# Patient Record
Sex: Female | Born: 1960 | Race: White | Hispanic: No | Marital: Married | Smoking: Former smoker
Health system: Southern US, Community
[De-identification: ages and names within clinical notes are randomized; demographics above are authoritative.]

## PROBLEM LIST (undated history)

## (undated) DIAGNOSIS — F32A Depression, unspecified: Secondary | ICD-10-CM

## (undated) DIAGNOSIS — E785 Hyperlipidemia, unspecified: Secondary | ICD-10-CM

## (undated) DIAGNOSIS — K219 Gastro-esophageal reflux disease without esophagitis: Secondary | ICD-10-CM

## (undated) DIAGNOSIS — J449 Chronic obstructive pulmonary disease, unspecified: Secondary | ICD-10-CM

## (undated) DIAGNOSIS — N289 Disorder of kidney and ureter, unspecified: Secondary | ICD-10-CM

## (undated) DIAGNOSIS — Z9989 Dependence on other enabling machines and devices: Secondary | ICD-10-CM

## (undated) DIAGNOSIS — L02215 Cutaneous abscess of perineum: Secondary | ICD-10-CM

## (undated) DIAGNOSIS — D649 Anemia, unspecified: Secondary | ICD-10-CM

## (undated) DIAGNOSIS — K5792 Diverticulitis of intestine, part unspecified, without perforation or abscess without bleeding: Secondary | ICD-10-CM

## (undated) DIAGNOSIS — R197 Diarrhea, unspecified: Secondary | ICD-10-CM

## (undated) DIAGNOSIS — G2581 Restless legs syndrome: Secondary | ICD-10-CM

## (undated) DIAGNOSIS — R011 Cardiac murmur, unspecified: Secondary | ICD-10-CM

## (undated) DIAGNOSIS — F329 Major depressive disorder, single episode, unspecified: Secondary | ICD-10-CM

## (undated) DIAGNOSIS — I10 Essential (primary) hypertension: Secondary | ICD-10-CM

## (undated) DIAGNOSIS — M1990 Unspecified osteoarthritis, unspecified site: Secondary | ICD-10-CM

## (undated) DIAGNOSIS — Z8719 Personal history of other diseases of the digestive system: Secondary | ICD-10-CM

## (undated) DIAGNOSIS — J45909 Unspecified asthma, uncomplicated: Secondary | ICD-10-CM

## (undated) DIAGNOSIS — G4733 Obstructive sleep apnea (adult) (pediatric): Secondary | ICD-10-CM

## (undated) HISTORY — PX: TUBAL LIGATION: SHX77

## (undated) HISTORY — DX: Essential (primary) hypertension: I10

## (undated) HISTORY — DX: Diarrhea, unspecified: R19.7

## (undated) HISTORY — PX: ANTERIOR AND POSTERIOR REPAIR: SHX1172

## (undated) HISTORY — PX: ABSCESS DRAINAGE: SHX1119

## (undated) HISTORY — PX: INCONTINENCE SURGERY: SHX676

## (undated) HISTORY — DX: Diverticulitis of intestine, part unspecified, without perforation or abscess without bleeding: K57.92

## (undated) HISTORY — DX: Unspecified asthma, uncomplicated: J45.909

## (undated) HISTORY — PX: CERVICAL CONE BIOPSY: SUR198

---

## 2013-02-04 DIAGNOSIS — G4733 Obstructive sleep apnea (adult) (pediatric): Secondary | ICD-10-CM

## 2013-02-04 HISTORY — DX: Obstructive sleep apnea (adult) (pediatric): G47.33

## 2013-10-05 DIAGNOSIS — K5792 Diverticulitis of intestine, part unspecified, without perforation or abscess without bleeding: Secondary | ICD-10-CM

## 2013-10-05 HISTORY — DX: Diverticulitis of intestine, part unspecified, without perforation or abscess without bleeding: K57.92

## 2014-03-01 ENCOUNTER — Encounter: Payer: Self-pay | Admitting: Gastroenterology

## 2014-03-23 ENCOUNTER — Ambulatory Visit (INDEPENDENT_AMBULATORY_CARE_PROVIDER_SITE_OTHER): Payer: BLUE CROSS/BLUE SHIELD | Admitting: Gastroenterology

## 2014-03-23 ENCOUNTER — Other Ambulatory Visit: Payer: Self-pay

## 2014-03-23 ENCOUNTER — Encounter (INDEPENDENT_AMBULATORY_CARE_PROVIDER_SITE_OTHER): Payer: Self-pay

## 2014-03-23 ENCOUNTER — Encounter: Payer: Self-pay | Admitting: Gastroenterology

## 2014-03-23 VITALS — BP 123/77 | HR 76 | Temp 97.4°F | Ht 61.0 in | Wt 277.0 lb

## 2014-03-23 DIAGNOSIS — R1013 Epigastric pain: Secondary | ICD-10-CM

## 2014-03-23 DIAGNOSIS — K5700 Diverticulitis of small intestine with perforation and abscess without bleeding: Secondary | ICD-10-CM

## 2014-03-23 DIAGNOSIS — R197 Diarrhea, unspecified: Secondary | ICD-10-CM

## 2014-03-23 MED ORDER — PEG-KCL-NACL-NASULF-NA ASC-C 100 G PO SOLR: 1.0000 | ORAL | Status: DC

## 2014-03-23 NOTE — Assessment & Plan Note (Addendum)
SX CHRONIC. MOST LIKELY DUE TO IBS BUT CELIAC SPRUE IN DIFFERENTIAL DIAGNOSIS, LESS LIKELY LACTOSE INTOLERANCE OR SIBO..  EGD TO EVALUATE FOR CELIAC SPRUE

## 2014-03-23 NOTE — Patient Instructions (Signed)
COMPLETE ENDOSCOPY WITHIN THE NEXT 3-4 WEEKS.  FULL LIQUID BREAKFAST ON MORNING OF EXAM. CLEAR LIQUIDS AFTER 9 AM. SEE INFO BELOW.  FOLLOW UP IN 4 MOS.   Full Liquid Diet A high-calorie, high-protein supplement should be used to meet your nutritional requirements when the full liquid diet is continued for more than 2 or 3 days. If this diet is to be used for an extended period of time (more than 7 days), a multivitamin should be considered.  Breads and Starches  Allowed: None are allowed except crackers WHOLE OR pureed (made into a thick, smooth soup) in soup. Cooked, refined corn, oat, rice, rye, and wheat cereals are also allowed.   Avoid: Any others.    Potatoes/Pasta/Rice  Allowed: ANY ITEM AS A SOUP OR SMALL PLATE OF MASHED POTATOES OR RICE.       Vegetables  Allowed: Strained tomato or vegetable juice. Vegetables pureed in soup.   Avoid: Any others.    Fruit  Allowed: Any strained fruit juices and fruit drinks. Include 1 serving of citrus or vitamin C-enriched fruit juice daily.   Avoid: Any others.  Meat and Meat Substitutes  Allowed: Egg  Avoid: Any meat, fish, or fowl. All cheese.  Milk  Allowed: Milk beverages, including milk shakes and instant breakfast mixes. Smooth yogurt.   Avoid: Any others. Avoid dairy products if not tolerated.    Soups and Combination Foods  Allowed: Broth, strained cream soups. Strained, broth-based soups.   Avoid: Any others.    Desserts and Sweets  Allowed: flavored gelatin, tapioca, plain ice cream, sherbet, smooth pudding, junket, fruit ices, frozen ice pops, pudding pops,, frozen fudge pops, chocolate syrup. Sugar, honey, jelly, syrup.   Avoid: Any others.  Fats and Oils  Allowed: Margarine, butter, cream, sour cream, oils.   Avoid: Any others.  Beverages  Allowed: All.   Avoid: None.  Condiments  Allowed: Iodized salt, pepper, spices, flavorings. Cocoa powder.   Avoid: Any others.    SAMPLE MEAL  PLAN Breakfast   cup orange juice.   1 cup cooked wheat cereal.   1 cup  milk.   1 cup beverage (coffee or tea).   Cream or sugar, if desired.    Midmorning Snack  2 SCRAMBLED OR HARD BOILED EGG   Lunch  1 cup cream soup.    cup fruit juice.   1 cup milk.    cup custard.   1 cup beverage (coffee or tea).   Cream or sugar, if desired.    Midafternoon Snack  1 cup milk shake.  Dinner  1 cup cream soup.    cup fruit juice.   1 cup milk.    cup pudding.   1 cup beverage (coffee or tea).   Cream or sugar, if desired.  Evening Snack  1 cup supplement.  To increase calories, add sugar, cream, butter, or margarine if possible. Nutritional supplements will also increase the total calories.     CLEAR LIQUID DIET  Nutrition Facts A clear liquid diet is not adequate in calories and nutrients. It should not be used for more than five days unless high-protein gelatin or other low-residue supplements are added. Special Considerations  1. Limitations The physician may limit certain liquids, depending on the patient's condition, or the surgery or test being performed. Therefore, individual instructions should be strictly followed. 2. What is a clear liquid? A good rule-of-thumb is anything you can see through. For example, apple juice is a clear liquid; milk is not.  If unsure, check with the physician or registered dietition. 3. After surgery and fasting Should persistent abdominal cramps or discomfort occur with a clear liquid diet, the patient should notify the physician, nurse, or dietitian at once. 4. Preparing for a medical test It is important that the clear liquid diet be followed exactly. Remember that the value of the examination will depend on getting a thoroughly clean digestive tract.   Food Groups  Group Recommend Avoid  Milk & milk products none all  Vegetables none all  Fruits fruit juices without pulp nectars; all fresh, canned, and  frozen fruits  Breads & grains none all  Meat or meat substitutes none all  Fats & oils none all  Sweets & desserts gelatin, fruit ice, popsicle without pulp, clear hard candy all others  Beverages coffee; tea; soft drinks; water; lactose-free, low residue supplements if approved by physiciancoffee; tea; soft drinks; water; lactose-free, low residue supplements if approved by physician all others  Soups bouillon, consomm fat free broth all others   Sample Menu  Breakfast Lunch Dinner  strained fruit juice 1 cup  gelatin 1 cup  hot tea with sugar & lemon consomm 3/4 cup  strained fruit juice 1 cup  fruit ice 1/2 cup  gelatin 1/2 cup  hot tea with sugar & lemon consomm 3/4 cup  strained fruit juice 1 cup  fruit ice 1/2 cup  gelatin 1/2 cup  hot tea with sugar & lemon   This Sample Diet Provides the Following  Calories 600 Fat virtually none  Protein 6 gm Sodium 1500 mg  Carbohydrates 209 gm Potassium 1440 mg

## 2014-03-23 NOTE — Progress Notes (Signed)
ON RECALL LIST  °

## 2014-03-23 NOTE — Progress Notes (Signed)
Subjective:    Patient ID: [name] [name], female    DOB: [birth year], 54 y.o.   MRN: [id number]  Moshe Cipro, MD  HPI Has pain in both sides of abdomen. BEFORE AGE 39 CONSTIPATION. AFTER AGE 39 HAS HAD DIARRHEA: NO TRIGGERS. USES IMODIUM TO CONTROL SYMPTOMS.  LOST DOWN TO 150 LBS. 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 WHEN SHE UPSET SHE EATS. HAD EGD 2000-DYSPEPSIA. MAY WAKE UP IN MIDDLE NIGHT WITH DIARRHEA: ONLY A COUPLE OF TIMES. BMs IF SHE EATS: AM WORSE(4/DAY NO BLOOD). MAY SEE BLOOD WHEN SHE WIPES.  NAUSEA: DAILY. HEARTBURN(BURNING IN CHEST) EVERY DAY: PROTONIX ONCE A DAY AND TUMS PRN. AT TIMES SHE CAN HAVE HARD STOOL(CONSTIPATION). MILK: NONE. ICE CREAM: COUPLE TIMES A MONTH. CHEESE: EATING SALADS AND NOW CHEESE DAILY. STOOLS NO WORSE. NO CYCLE SINCE JUL 2015. WAS HAVING HEAVY PERIODS.   PT DENIES FEVER, CHILLS,  HEMATEMESIS, vomiting, melena, SHORTNESS OF BREATH,  CHANGE IN BOWEL IN HABITS, constipation, problems swallowing, OR  problems with sedation.  Past Medical History  Diagnosis Date  . Diarrhea age 79  . Sleep apnea 2015  . HTN (hypertension)   . Asthma   . Diverticulitis SEP 2015    PERFORATED, SIGMOID COLON   Past Surgical History  Procedure Laterality Date  . Tubal ligation    . Cervical cone biopsy    . Bladder surgery    . Rectocele repair     Allergies  Allergen Reactions  . Penicillins Shortness Of Breath   Current Outpatient Prescriptions  Medication Sig Dispense Refill  . albuterol (PROVENTIL HFA;VENTOLIN HFA) 108 (90 BASE) MCG/ACT inhaler Inhale into the lungs every 6 (six) hours as needed for wheezing or shortness of breath.    Marland Kitchen atorvastatin (LIPITOR) 10 MG tablet Take 10 mg by mouth daily.    Marland Kitchen buPROPion (ZYBAN) 150 MG 12 hr tablet Take 150 mg by mouth 2 (two) times daily.    . ferrous fumarate-iron polysaccharide complex (TANDEM) 162-115.2 MG CAPS Take 1 capsule by mouth daily with breakfast. MOST DAYS   . Fluticasone-Salmeterol  (ADVAIR) 250-50 MCG/DOSE AEPB Inhale 1 puff into the lungs 2 (two) times daily.    Marland Kitchen losartan (COZAAR) 50 MG tablet Take 50 mg by mouth daily.    . nebivolol (BYSTOLIC) 5 MG tablet Take 5 mg by mouth daily.    . pantoprazole (PROTONIX) 40 MG tablet Take 40 mg by mouth daily.     . pramipexole (MIRAPEX) 0.5 MG tablet Take 0.5 mg by mouth 3 (three) times daily.    Marland Kitchen venlafaxine XR (EFFEXOR-XR) 150 MG 24 hr capsule Take 150 mg by mouth daily with breakfast.     Family History  Problem Relation Age of Onset  . Diverticulitis Mother   . Diverticulitis Maternal Aunt   . Diverticulitis Maternal Grandmother     History  Substance Use Topics  . Smoking status: Former Smoker    Quit date: 05/21/1997  . Smokeless tobacco: Not on file  . Alcohol Use: Not on file   Review of Systems PER HPI OTHERWISE ALL SYSTEMS ARE NEGATIVE.     Objective:   Physical Exam  Constitutional: She is oriented to person, place, and time. She appears well-developed and well-nourished. No distress.  HENT:  Head: Normocephalic and atraumatic.  Mouth/Throat: Oropharynx is clear and moist. No oropharyngeal exudate.  Eyes: Pupils are equal, round, and reactive to light. No scleral icterus.  Neck: Normal range of motion. Neck supple.  Cardiovascular: Normal rate,  regular rhythm and normal heart sounds.   Pulmonary/Chest: Effort normal and breath sounds normal. No respiratory distress.  Abdominal: Soft. Bowel sounds are normal. She exhibits no distension. There is no tenderness.  Musculoskeletal: She exhibits no edema.  Lymphadenopathy:    She has no cervical adenopathy.  Neurological: She is alert and oriented to person, place, and time.  NO FOCAL DEFICITS   Psychiatric: She has a normal mood and affect.  Vitals reviewed.         Assessment & Plan:

## 2014-03-23 NOTE — Assessment & Plan Note (Signed)
SX RESOLVED.  NEEDS TCS TO EVALUATE COLON. CONSIDER SIGMOID COLECTOMY. DISCUSSED PROCEDURE, BENEFITS, & RISKS: < 1% chance of medication reaction, bleeding, perforation, or rupture of spleen/liver. MOVIPREP-PHENRGANI N PREOP-CPAP FOLLOW UP IN 4 MOS.

## 2014-03-23 NOTE — Assessment & Plan Note (Signed)
SX MAY BE DUE TO IBS, LESS LIKELY CELIAC SPRUE OR H PYLORI GASTRITIS.  EGD W/I 3-4 WEEKS. DISCUSSED PROCEDURE, BENEFITS, & RISKS: < 1% chance of medication reaction, OR bleeding.

## 2014-03-29 NOTE — Progress Notes (Signed)
CC'ED TO PCP 

## 2014-04-05 ENCOUNTER — Encounter (HOSPITAL_COMMUNITY): Payer: Self-pay | Admitting: *Deleted

## 2014-04-05 ENCOUNTER — Ambulatory Visit (HOSPITAL_COMMUNITY)
Admission: RE | Admit: 2014-04-05 | Discharge: 2014-04-05 | Disposition: A | Discharge status: Home / Self Care | Payer: BLUE CROSS/BLUE SHIELD | Source: Ambulatory Visit | Attending: Gastroenterology | Admitting: Gastroenterology

## 2014-04-05 ENCOUNTER — Encounter (HOSPITAL_COMMUNITY)
Admission: RE | Disposition: A | Discharge status: Home / Self Care | Payer: Self-pay | Source: Ambulatory Visit | Attending: Gastroenterology

## 2014-04-05 DIAGNOSIS — K648 Other hemorrhoids: Secondary | ICD-10-CM | POA: Insufficient documentation

## 2014-04-05 DIAGNOSIS — J45909 Unspecified asthma, uncomplicated: Secondary | ICD-10-CM | POA: Diagnosis not present

## 2014-04-05 DIAGNOSIS — Z9851 Tubal ligation status: Secondary | ICD-10-CM | POA: Insufficient documentation

## 2014-04-05 DIAGNOSIS — K317 Polyp of stomach and duodenum: Secondary | ICD-10-CM | POA: Diagnosis not present

## 2014-04-05 DIAGNOSIS — Z880 Allergy status to penicillin: Secondary | ICD-10-CM | POA: Insufficient documentation

## 2014-04-05 DIAGNOSIS — K5730 Diverticulosis of large intestine without perforation or abscess without bleeding: Secondary | ICD-10-CM | POA: Diagnosis not present

## 2014-04-05 DIAGNOSIS — G4730 Sleep apnea, unspecified: Secondary | ICD-10-CM | POA: Insufficient documentation

## 2014-04-05 DIAGNOSIS — R1013 Epigastric pain: Secondary | ICD-10-CM | POA: Diagnosis present

## 2014-04-05 DIAGNOSIS — Z87891 Personal history of nicotine dependence: Secondary | ICD-10-CM | POA: Diagnosis not present

## 2014-04-05 DIAGNOSIS — R197 Diarrhea, unspecified: Secondary | ICD-10-CM | POA: Diagnosis present

## 2014-04-05 DIAGNOSIS — I10 Essential (primary) hypertension: Secondary | ICD-10-CM | POA: Insufficient documentation

## 2014-04-05 DIAGNOSIS — K2950 Unspecified chronic gastritis without bleeding: Secondary | ICD-10-CM | POA: Diagnosis not present

## 2014-04-05 HISTORY — PX: COLONOSCOPY: SHX5424

## 2014-04-05 HISTORY — PX: ESOPHAGOGASTRODUODENOSCOPY: SHX5428

## 2014-04-05 SURGERY — COLONOSCOPY
Anesthesia: Moderate Sedation

## 2014-04-05 MED ORDER — PROMETHAZINE HCL 25 MG/ML IJ SOLN
INTRAMUSCULAR | Status: DC | PRN
  Administered 2014-04-05: 12.5 mg via INTRAVENOUS

## 2014-04-05 MED ORDER — PROMETHAZINE HCL 25 MG/ML IJ SOLN
12.5000 mg | Freq: Once | INTRAMUSCULAR | Status: AC
  Administered 2014-04-05: 12.5 mg via INTRAVENOUS
  Filled 2014-04-05: qty 1

## 2014-04-05 MED ORDER — SODIUM CHLORIDE 0.9 % IJ SOLN
INTRAMUSCULAR | Status: AC
  Filled 2014-04-05: qty 3

## 2014-04-05 MED ORDER — MEPERIDINE HCL 100 MG/ML IJ SOLN
INTRAMUSCULAR | Status: AC
  Filled 2014-04-05: qty 2

## 2014-04-05 MED ORDER — LIDOCAINE VISCOUS 2 % MT SOLN
OROMUCOSAL | Status: AC
  Filled 2014-04-05: qty 15

## 2014-04-05 MED ORDER — SODIUM CHLORIDE 0.9 % IV SOLN
INTRAVENOUS | Status: DC
  Administered 2014-04-05: 11:00:00 via INTRAVENOUS

## 2014-04-05 MED ORDER — STERILE WATER FOR IRRIGATION IR SOLN
Status: DC | PRN
  Administered 2014-04-05: 11:00:00

## 2014-04-05 MED ORDER — MEPERIDINE HCL 100 MG/ML IJ SOLN
INTRAMUSCULAR | Status: DC | PRN
  Administered 2014-04-05: 50 mg via INTRAVENOUS
  Administered 2014-04-05 (×4): 25 mg via INTRAVENOUS

## 2014-04-05 MED ORDER — MIDAZOLAM HCL 5 MG/5ML IJ SOLN
INTRAMUSCULAR | Status: DC | PRN
  Administered 2014-04-05 (×4): 1 mg via INTRAVENOUS
  Administered 2014-04-05: 2 mg via INTRAVENOUS
  Administered 2014-04-05: 1 mg via INTRAVENOUS

## 2014-04-05 MED ORDER — MIDAZOLAM HCL 5 MG/5ML IJ SOLN
INTRAMUSCULAR | Status: AC
  Filled 2014-04-05: qty 10

## 2014-04-05 MED ORDER — PROMETHAZINE HCL 25 MG/ML IJ SOLN: 12.5000 mg | Freq: Once | INTRAMUSCULAR | Status: DC

## 2014-04-05 NOTE — Discharge Instructions (Signed)
NO OBVIOUS SOURCE FOR YOUR DIARRHEA WAS IDENTIFIED. You have internal hemorrhoids & diverticulosis IN YOUR SIGMOID COLON.  I TATTOOED THE BASE WHERE THE DIVERTICULA START. You have gastritis AND GASTRIC POLYPS. I biopsied your stomach, SMALL BOWEL, AND COLON.   FOLLOW A HIGH FIBER/LOW FAT DIET. AVOID ITEMS THAT CAUSE BLOATING. SEE INFO BELOW.  AVOID ITEMS THAT TRIGGER GASTRITIS. SEE INFO BELOW  CONTINUE YOUR WEIGHT LOSS EFFORTS. LOSE 10 LBS.  CONTINUE PROTONIX. TAKE 30 MINUTES PRIOR TO BREAKFAST.  YOUR BIOPSY RESULTS WILL BE AVAILABLE IN MY CHART AFTER MAR 3  OR MY OFFICE WILL CONTACT YOU IN 10-14 DAYS WITH YOUR RESULTS.   FOLLOW UP IN JUN 2016.  Next colonoscopy in 10 years.    ENDOSCOPY Care After Read the instructions outlined below and refer to this sheet in the next week. These discharge instructions provide you with general information on caring for yourself after you leave the hospital. While your treatment has been planned according to the most current medical practices available, unavoidable complications occasionally occur. If you have any problems or questions after discharge, call DR. Amiliana Foutz, [phone].  ACTIVITY  You may resume your regular activity, but move at a slower pace for the next 24 hours.   Take frequent rest periods for the next 24 hours.   Walking will help get rid of the air and reduce the bloated feeling in your belly (abdomen).   No driving for 24 hours (because of the medicine (anesthesia) used during the test).   You may shower.   Do not sign any important legal documents or operate any machinery for 24 hours (because of the anesthesia used during the test).    NUTRITION  Drink plenty of fluids.   You may resume your normal diet as instructed by your doctor.   Begin with a light meal and progress to your normal diet. Heavy or fried foods are harder to digest and may make you feel sick to your stomach (nauseated).   Avoid alcoholic  beverages for 24 hours or as instructed.    MEDICATIONS  You may resume your normal medications.   WHAT YOU CAN EXPECT TODAY  Some feelings of bloating in the abdomen.   Passage of more gas than usual.   Spotting of blood in your stool or on the toilet paper  .  IF YOU HAD POLYPS REMOVED DURING THE ENDOSCOPY:  Eat a soft diet IF YOU HAVE NAUSEA, BLOATING, ABDOMINAL PAIN, OR VOMITING.    FINDING OUT THE RESULTS OF YOUR TEST Not all test results are available during your visit. DR. Oneida Alar WILL CALL YOU WITHIN 14 DAYS OF YOUR PROCEDUE WITH YOUR RESULTS. Do not assume everything is normal if you have not heard from DR. Fadel Clason, CALL HER OFFICE AT [phone].  SEEK IMMEDIATE MEDICAL ATTENTION AND CALL THE OFFICE: [phone] IF:  You have more than a spotting of blood in your stool.   Your belly is swollen (abdominal distention).   You are nauseated or vomiting.   You have a temperature over 101F.   You have abdominal pain or discomfort that is severe or gets worse throughout the day.   Gastritis  Gastritis is an inflammation (the body's way of reacting to injury and/or infection) of the stomach. It is often caused by viral or bacterial (germ) infections. It can also be caused BY ASPIRIN, BC/GOODY POWDER'S, (IBUPROFEN) MOTRIN, OR ALEVE (NAPROXEN), chemicals (including alcohol), SPICY FOODS, and medications. This illness may be associated with generalized malaise (feeling tired, not  well), UPPER ABDOMINAL STOMACH cramps, and fever. One common bacterial cause of gastritis is an organism known as H. Pylori. This can be treated with antibiotics.    High-Fiber Diet A high-fiber diet changes your normal diet to include more whole grains, legumes, fruits, and vegetables. Changes in the diet involve replacing refined carbohydrates with unrefined foods. The calorie level of the diet is essentially unchanged. The Dietary Reference Intake (recommended amount) for adult males is 38 grams  per day. For adult females, it is 25 grams per day. Pregnant and lactating women should consume 28 grams of fiber per day. Fiber is the intact part of a plant that is not broken down during digestion. Functional fiber is fiber that has been isolated from the plant to provide a beneficial effect in the body. PURPOSE  Increase stool bulk.   Ease and regulate bowel movements.   Lower cholesterol.  INDICATIONS THAT YOU NEED MORE FIBER  Constipation and hemorrhoids.   Uncomplicated diverticulosis (intestine condition) and irritable bowel syndrome.   Weight management.   As a protective measure against hardening of the arteries (atherosclerosis), diabetes, and cancer.   GUIDELINES FOR INCREASING FIBER IN THE DIET  Start adding fiber to the diet slowly. A gradual increase of about 5 more grams (2 slices of whole-wheat bread, 2 servings of most fruits or vegetables, or 1 bowl of high-fiber cereal) per day is best. Too rapid an increase in fiber may result in constipation, flatulence, and bloating.   Drink enough water and fluids to keep your urine clear or pale yellow. Water, juice, or caffeine-free drinks are recommended. Not drinking enough fluid may cause constipation.   Eat a variety of high-fiber foods rather than one type of fiber.   Try to increase your intake of fiber through using high-fiber foods rather than fiber pills or supplements that contain small amounts of fiber.   The goal is to change the types of food eaten. Do not supplement your present diet with high-fiber foods, but replace foods in your present diet.  INCLUDE A VARIETY OF FIBER SOURCES  Replace refined and processed grains with whole grains, canned fruits with fresh fruits, and incorporate other fiber sources. White rice, white breads, and most bakery goods contain little or no fiber.   Brown whole-grain rice, buckwheat oats, and many fruits and vegetables are all good sources of fiber. These include: broccoli,  Brussels sprouts, cabbage, cauliflower, beets, sweet potatoes, white potatoes (skin on), carrots, tomatoes, eggplant, squash, berries, fresh fruits, and dried fruits.   Cereals appear to be the richest source of fiber. Cereal fiber is found in whole grains and bran. Bran is the fiber-rich outer coat of cereal grain, which is largely removed in refining. In whole-grain cereals, the bran remains. In breakfast cereals, the largest amount of fiber is found in those with "bran" in their names. The fiber content is sometimes indicated on the label.   You may need to include additional fruits and vegetables each day.   In baking, for 1 cup white flour, you may use the following substitutions:   1 cup whole-wheat flour minus 2 tablespoons.   1/2 cup white flour plus 1/2 cup whole-wheat flour.   Low-Fat Diet BREADS, CEREALS, PASTA, RICE, DRIED PEAS, AND BEANS These products are high in carbohydrates and most are low in fat. Therefore, they can be increased in the diet as substitutes for fatty foods. They too, however, contain calories and should not be eaten in excess. Cereals can be eaten for  snacks as well as for breakfast.  Include foods that contain fiber (fruits, vegetables, whole grains, and legumes). Research shows that fiber may lower blood cholesterol levels, especially the water-soluble fiber found in fruits, vegetables, oat products, and legumes. FRUITS AND VEGETABLES It is good to eat fruits and vegetables. Besides being sources of fiber, both are rich in vitamins and some minerals. They help you get the daily allowances of these nutrients. Fruits and vegetables can be used for snacks and desserts. MEATS Limit lean meat, chicken, [place], and fish to no more than 6 ounces per day. Beef, Pork, and Lamb Use lean cuts of beef, pork, and lamb. Lean cuts include:  Extra-lean ground beef.  Arm roast.  Sirloin tip.  Center-cut ham.  Round steak.  Loin chops.  Rump roast.  Tenderloin.  Trim  all fat off the outside of meats before cooking. It is not necessary to severely decrease the intake of red meat, but lean choices should be made. Lean meat is rich in protein and contains a highly absorbable form of iron. Premenopausal women, in particular, should avoid reducing lean red meat because this could increase the risk for low red blood cells (iron-deficiency anemia). The organ meats, such as liver, sweetbreads, kidneys, and brain are very rich in cholesterol. They should be limited. Chicken and [place] These are good sources of protein. The fat of poultry can be reduced by removing the skin and underlying fat layers before cooking. Chicken and [place] can be substituted for lean red meat in the diet. Poultry should not be fried or covered with high-fat sauces. Fish and Shellfish Fish is a good source of protein. Shellfish contain cholesterol, but they usually are low in saturated fatty acids. The preparation of fish is important. Like chicken and [place], they should not be fried or covered with high-fat sauces. EGGS Egg whites contain no fat or cholesterol. They can be eaten often. Try 1 to 2 egg whites instead of whole eggs in recipes or use egg substitutes that do not contain yolk. MILK AND DAIRY PRODUCTS Use skim or 1% milk instead of 2% or whole milk. Decrease whole milk, natural, and processed cheeses. Use nonfat or low-fat (2%) cottage cheese or low-fat cheeses made from vegetable oils. Choose nonfat or low-fat (1 to 2%) yogurt. Experiment with evaporated skim milk in recipes that call for heavy cream. Substitute low-fat yogurt or low-fat cottage cheese for sour cream in dips and salad dressings. Have at least 2 servings of low-fat dairy products, such as 2 glasses of skim (or 1%) milk each day to help get your daily calcium intake.  FATS AND OILS Reduce the total intake of fats, especially saturated fat. Butterfat, lard, and beef fats are high in saturated fat and cholesterol. These  should be avoided as much as possible. Vegetable fats do not contain cholesterol, but certain vegetable fats, such as coconut oil, palm oil, and palm kernel oil are very high in saturated fats. These should be limited. These fats are often used in bakery goods, processed foods, popcorn, oils, and nondairy creamers. Vegetable shortenings and some peanut butters contain hydrogenated oils, which are also saturated fats. Read the labels on these foods and check for saturated vegetable oils. Unsaturated vegetable oils and fats do not raise blood cholesterol. However, they should be limited because they are fats and are high in calories. Total fat should still be limited to 30% of your daily caloric intake. Desirable liquid vegetable oils are corn oil, cottonseed oil, olive oil,  canola oil, safflower oil, soybean oil, and sunflower oil. Peanut oil is not as good, but small amounts are acceptable. Buy a heart-healthy tub margarine that has no partially hydrogenated oils in the ingredients. Mayonnaise and salad dressings often are made from unsaturated fats, but they should also be limited because of their high calorie and fat content. Seeds, nuts, peanut butter, olives, and avocados are high in fat, but the fat is mainly the unsaturated type. These foods should be limited mainly to avoid excess calories and fat. OTHER EATING TIPS Snacks  Most sweets should be limited as snacks. They tend to be rich in calories and fats, and their caloric content outweighs their nutritional value. Some good choices in snacks are graham crackers, melba toast, soda crackers, bagels (no egg), English muffins, fruits, and vegetables. These snacks are preferable to snack crackers, [place] fries, and chips. Popcorn should be air-popped or cooked in small amounts of liquid vegetable oil. Desserts Eat fruit, low-fat yogurt, and fruit ices. AVOID pastries, cake, and cookies. Sherbet, angel food cake, gelatin dessert, frozen low-fat yogurt, or  other frozen products that do not contain saturated fat (pure fruit juice bars, frozen ice pops) are also acceptable.  COOKING METHODS Choose those methods that use little or no fat. They include: Poaching.  Braising.  Steaming.  Grilling.  Baking.  Stir-frying.  Broiling.  Microwaving.  Foods can be cooked in a nonstick pan without added fat, or use a nonfat cooking spray in regular cookware. Limit fried foods and avoid frying in saturated fat. Add moisture to lean meats by using water, broth, cooking wines, and other nonfat or low-fat sauces along with the cooking methods mentioned above. Soups and stews should be chilled after cooking. The fat that forms on top after a few hours in the refrigerator should be skimmed off. When preparing meals, avoid using excess salt. Salt can contribute to raising blood pressure in some people. EATING AWAY FROM HOME Order entres, potatoes, and vegetables without sauces or butter. When meat exceeds the size of a deck of cards (3 to 4 ounces), the rest can be taken home for another meal. Choose vegetable or fruit salads and ask for low-calorie salad dressings to be served on the side. Use dressings sparingly. Limit high-fat toppings, such as bacon, crumbled eggs, cheese, sunflower seeds, and olives. Ask for heart-healthy tub margarine instead of butter.   Diverticulosis Diverticulosis is a common condition that develops when small pouches (diverticula) form in the wall of the colon. The risk of diverticulosis increases with age. It happens more often in people who eat a low-fiber diet. Most individuals with diverticulosis have no symptoms. Those individuals with symptoms usually experience belly (abdominal) pain, constipation, or loose stools (diarrhea).  HOME CARE INSTRUCTIONS  Increase the amount of fiber in your diet as directed by your caregiver or dietician. This may reduce symptoms of diverticulosis.   Drink at least 6 to 8 glasses of water each day  to prevent constipation.   Try not to strain when you have a bowel movement.   Avoiding nuts and seeds to prevent complications is still an uncertain benefit.       FOODS HAVING HIGH FIBER CONTENT INCLUDE:  Fruits. Apple, peach, pear, tangerine, raisins, prunes.   Vegetables. Brussels sprouts, asparagus, broccoli, cabbage, carrot, cauliflower, romaine lettuce, spinach, summer squash, tomato, winter squash, zucchini.   Starchy Vegetables. Baked beans, kidney beans, lima beans, split peas, lentils, potatoes (with skin).   Grains. Whole wheat bread, brown rice, bran  flake cereal, plain oatmeal, white rice, shredded wheat, bran muffins.    SEEK IMMEDIATE MEDICAL CARE IF:  You develop increasing pain or severe bloating.   You have an oral temperature above 101F.   You develop vomiting or bowel movements that are bloody or black.    Hemorrhoids Hemorrhoids are dilated (enlarged) veins around the rectum. Sometimes clots will form in the veins. This makes them swollen and painful. These are called thrombosed hemorrhoids. Causes of hemorrhoids include:  Constipation.   Straining to have a bowel movement.   HEAVY LIFTING HOME CARE INSTRUCTIONS  Eat a well balanced diet and drink 6 to 8 glasses of water every day to avoid constipation. You may also use a bulk laxative.   Avoid straining to have bowel movements.   Keep anal area dry and clean.   Do not use a donut shaped pillow or sit on the toilet for long periods. This increases blood pooling and pain.   Move your bowels when your body has the urge; this will require less straining and will decrease pain and pressure.

## 2014-04-05 NOTE — H&P (View-Only) (Signed)
Subjective:    Patient ID: [name] [name], female    DOB: Jan 23, 1961, 54 y.o.   MRN: [id number]  Moshe Cipro, MD  HPI Has pain in both sides of abdomen. BEFORE AGE 69 CONSTIPATION. AFTER AGE 69 HAS HAD DIARRHEA: NO TRIGGERS. USES IMODIUM TO CONTROL SYMPTOMS.  LOST DOWN TO 150 LBS. 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 WHEN SHE UPSET SHE EATS. HAD EGD 2000-DYSPEPSIA. MAY WAKE UP IN MIDDLE NIGHT WITH DIARRHEA: ONLY A COUPLE OF TIMES. BMs IF SHE EATS: AM WORSE(4/DAY NO BLOOD). MAY SEE BLOOD WHEN SHE WIPES.  NAUSEA: DAILY. HEARTBURN(BURNING IN CHEST) EVERY DAY: PROTONIX ONCE A DAY AND TUMS PRN. AT TIMES SHE CAN HAVE HARD STOOL(CONSTIPATION). MILK: NONE. ICE CREAM: COUPLE TIMES A MONTH. CHEESE: EATING SALADS AND NOW CHEESE DAILY. STOOLS NO WORSE. NO CYCLE SINCE JUL 2015. WAS HAVING HEAVY PERIODS.   PT DENIES FEVER, CHILLS,  HEMATEMESIS, vomiting, melena, SHORTNESS OF BREATH,  CHANGE IN BOWEL IN HABITS, constipation, problems swallowing, OR  problems with sedation.  Past Medical History  Diagnosis Date  . Diarrhea age 66  . Sleep apnea 2015  . HTN (hypertension)   . Asthma   . Diverticulitis SEP 2015    PERFORATED, SIGMOID COLON   Past Surgical History  Procedure Laterality Date  . Tubal ligation    . Cervical cone biopsy    . Bladder surgery    . Rectocele repair     Allergies  Allergen Reactions  . Penicillins Shortness Of Breath   Current Outpatient Prescriptions  Medication Sig Dispense Refill  . albuterol (PROVENTIL HFA;VENTOLIN HFA) 108 (90 BASE) MCG/ACT inhaler Inhale into the lungs every 6 (six) hours as needed for wheezing or shortness of breath.    Marland Kitchen atorvastatin (LIPITOR) 10 MG tablet Take 10 mg by mouth daily.    Marland Kitchen buPROPion (ZYBAN) 150 MG 12 hr tablet Take 150 mg by mouth 2 (two) times daily.    . ferrous fumarate-iron polysaccharide complex (TANDEM) 162-115.2 MG CAPS Take 1 capsule by mouth daily with breakfast. MOST DAYS   . Fluticasone-Salmeterol  (ADVAIR) 250-50 MCG/DOSE AEPB Inhale 1 puff into the lungs 2 (two) times daily.    Marland Kitchen losartan (COZAAR) 50 MG tablet Take 50 mg by mouth daily.    . nebivolol (BYSTOLIC) 5 MG tablet Take 5 mg by mouth daily.    . pantoprazole (PROTONIX) 40 MG tablet Take 40 mg by mouth daily.     . pramipexole (MIRAPEX) 0.5 MG tablet Take 0.5 mg by mouth 3 (three) times daily.    Marland Kitchen venlafaxine XR (EFFEXOR-XR) 150 MG 24 hr capsule Take 150 mg by mouth daily with breakfast.     Family History  Problem Relation Age of Onset  . Diverticulitis Mother   . Diverticulitis Maternal Aunt   . Diverticulitis Maternal Grandmother     History  Substance Use Topics  . Smoking status: Former Smoker    Quit date: 05/21/1997  . Smokeless tobacco: Not on file  . Alcohol Use: Not on file   Review of Systems PER HPI OTHERWISE ALL SYSTEMS ARE NEGATIVE.     Objective:   Physical Exam  Constitutional: She is oriented to person, place, and time. She appears well-developed and well-nourished. No distress.  HENT:  Head: Normocephalic and atraumatic.  Mouth/Throat: Oropharynx is clear and moist. No oropharyngeal exudate.  Eyes: Pupils are equal, round, and reactive to light. No scleral icterus.  Neck: Normal range of motion. Neck supple.  Cardiovascular: Normal rate,  regular rhythm and normal heart sounds.   Pulmonary/Chest: Effort normal and breath sounds normal. No respiratory distress.  Abdominal: Soft. Bowel sounds are normal. She exhibits no distension. There is no tenderness.  Musculoskeletal: She exhibits no edema.  Lymphadenopathy:    She has no cervical adenopathy.  Neurological: She is alert and oriented to person, place, and time.  NO FOCAL DEFICITS   Psychiatric: She has a normal mood and affect.  Vitals reviewed.         Assessment & Plan:

## 2014-04-05 NOTE — Interval H&P Note (Signed)
History and Physical Interval Note:  04/05/2014 10:58 AM  [name] [name]  has presented today for surgery, with the diagnosis of DIARRHEA/PERFERATED DIVERTICULSIS/DYSPEPSIA  The various methods of treatment have been discussed with the patient and family. After consideration of risks, benefits and other options for treatment, the patient has consented to  Procedure(s) with comments: COLONOSCOPY (N/A) - 1045 ESOPHAGOGASTRODUODENOSCOPY (EGD) (N/A) as a surgical intervention .  The patient's history has been reviewed, patient examined, no change in status, stable for surgery.  I have reviewed the patient's chart and labs.  Questions were answered to the patient's satisfaction.     [employer]

## 2014-04-05 NOTE — Op Note (Signed)
[facility] [address] [place], [address]   ENDOSCOPY PROCEDURE REPORT  PATIENT: [name] [name]  MR#: [id number] BIRTHDATE: 09-03-1960 , 41  yrs. old GENDER: female  ENDOSCOPIST: Danie Binder, MD REFERRED UM:PNTIRWE Currie Paris, MD  PROCEDURE DATE: Apr 15, 2014 PROCEDURE:   EGD w/ biopsy  INDICATIONS:diarrhea.   dyspepsia. MEDICATIONS: Versed 1 mg IV and Demerol 25 mg IV TOPICAL ANESTHETIC: ASA CLASS:  DESCRIPTION OF PROCEDURE:     Physical exam was performed.  Informed consent was obtained from the patient after explaining the benefits, risks, and alternatives to the procedure.  The patient was connected to the monitor and placed in the left lateral position.  Continuous oxygen was provided by nasal cannula and IV medicine administered through an indwelling cannula.  After administration of sedation, the patients esophagus was intubated and the EC-[id number] ([id number])  endoscope was advanced under direct visualization to the second portion of the duodenum.  The scope was removed slowly by carefully examining the color, texture, anatomy, and integrity of the mucosa on the way out.  The patient was recovered in endoscopy and discharged home in satisfactory condition.   ESOPHAGUS: The mucosa of the esophagus appeared normal. STOMACH: Mild erosive gastritis (inflammation) was found in the gastric antrum and at the pylorus.  Multiple biopsies were performed using cold forceps.   Multiple polyps ranging between 3-54mm in size were found in the gastric body and gastric fundus.  Multiple biopsies was performed using cold forceps.   DUODENUM: The duodenal mucosa showed no abnormalities in the bulb and 2nd part of the duodenum. Cold forceps biopsies were taken in the bulb and second portion.  COMPLICATIONS: There were no immediate complications.  ENDOSCOPIC IMPRESSION: 1.   NO OBVIOUS SOURCE FOR DIARRHEA IDENTIFIED. 2.   MILD Erosive gastritis 3.   Multiple GASTRIC  polyps  RECOMMENDATIONS: FOLLOW A HIGH FIBER/LOW FAT DIET.  AVOID ITEMS THAT CAUSE BLOATING.  AVOID ITEMS THAT TRIGGER GASTRITIS. CONTINUE YOUR WEIGHT LOSS EFFORTS.  LOSE 10 LBS. CONTINUE PROTONIX.  TAKE 30 MINUTES PRIOR TO BREAKFAST. AWAIT BIOPSY. FOLLOW UP IN JUN 2016. Next colonoscopy in 10 years.  REPEAT EXAM: eSigned:  Danie Binder, MD 04/15/14 12:44 PM revise CPT CODES: ICD CODES:  The ICD and CPT codes recommended by this software are interpretations from the data that the clinical staff has captured with the software.  The verification of the translation of this report to the ICD and CPT codes and modifiers is the sole responsibility of the health care institution and practicing physician where this report was generated.  [employer] will not be held responsible for the validity of the ICD and CPT codes included on this report.  AMA assumes no liability for data contained or not contained herein. CPT is a Designer, television/film set of the [employer].

## 2014-04-05 NOTE — Op Note (Signed)
[facility] [address] [place], [address]   COLONOSCOPY PROCEDURE REPORT  PATIENT: [name] [name], [name] [name]  MR#: [id number] BIRTHDATE: 1960-04-25 , 52  yrs. old GENDER: female ENDOSCOPIST: Danie Binder, MD REFERRED BM:[id number] Currie Paris, MD PROCEDURE DATE:  2014-04-16 PROCEDURE:   Colonoscopy with biopsy and Submucosal injection(SPOT 1 CC) INDICATIONS:unexplained diarrhea and , PMHx: PERFORATED DIVERTICULITIS. MEDICATIONS: Promethazine (Phenergan) 25 mg IV, Meperidine (Demerol) 125 mg IV, and Versed 6 mg IV  DESCRIPTION OF PROCEDURE:    Physical exam was performed.  Informed consent was obtained from the patient after explaining the benefits, risks, and alternatives to procedure.  The patient was connected to monitor and placed in left lateral position. Continuous oxygen was provided by nasal cannula and IV medicine administered through an indwelling cannula.  After administration of sedation and rectal exam, the patients rectum was intubated and the EC-[id number] ([id number])  colonoscope was advanced under direct visualization to the ileum.  The scope was removed slowly by carefully examining the color, texture, anatomy, and integrity mucosa on the way out.  The patient was recovered in endoscopy and discharged home in satisfactory condition.     COLON FINDINGS: The examined terminal ileum appeared to be normal. , There was moderate diverticulosis noted in the sigmoid colon with associated muscular hypertrophy, angulation and tortuosity. 1 CC SPOT PLACED PROXIIMAL TO THE MOST PROXIMAL DIVERTICULA.  , The examination was otherwise normal.  , and Small internal hemorrhoids were found.  PREP QUALITY: excellent.  CECAL W/D TIME: 12       minutes COMPLICATIONS: None  ENDOSCOPIC IMPRESSION: 1.   NO SOURCE FOR DIARRHEA IDENTIFIED. 2.   Moderate diverticulosis in the sigmoid colon 3.   Small internal hemorrhoids  RECOMMENDATIONS: FOLLOW A HIGH FIBER/LOW FAT DIET.  AVOID  ITEMS THAT CAUSE BLOATING.  AVOID ITEMS THAT TRIGGER GASTRITIS. CONTINUE YOUR WEIGHT LOSS EFFORTS.  LOSE 10 LBS. CONTINUE PROTONIX.  TAKE 30 MINUTES PRIOR TO BREAKFAST. AWAIT BIOPSY. FOLLOW UP IN JUN 2016. Next colonoscopy in 10 years.   _______________________________ Lorrin MaisDanie Binder, MD 04/16/14 12:27 PM   CPT CODES: ICD CODES:  The ICD and CPT codes recommended by this software are interpretations from the data that the clinical staff has captured with the software.  The verification of the translation of this report to the ICD and CPT codes and modifiers is the sole responsibility of the health care institution and practicing physician where this report was generated.  [employer] will not be held responsible for the validity of the ICD and CPT codes included on this report.  AMA assumes no liability for data contained or not contained herein. CPT is a Designer, television/film set of the [employer].

## 2014-04-06 ENCOUNTER — Encounter (HOSPITAL_COMMUNITY): Payer: Self-pay | Admitting: Gastroenterology

## 2014-04-09 ENCOUNTER — Telehealth: Payer: Self-pay | Admitting: Gastroenterology

## 2014-04-09 NOTE — Telephone Encounter (Signed)
Please call pt. HER stomach Bx shows gastritis AND BENIGN STOMACH POLYPS. Her colon and small bowel biopsies are normal.    FOLLOW A HIGH FIBER/LOW FAT DIET. AVOID ITEMS THAT CAUSE BLOATING.  AVOID ITEMS THAT TRIGGER GASTRITIS.   CONTINUE YOUR WEIGHT LOSS EFFORTS. LOSE 10 LBS.  CONTINUE PROTONIX. TAKE 30 MINUTES PRIOR TO BREAKFAST.  FOLLOW UP IN JUN 2016 E30 DIARRHEA/DIVERTICULOSIS.  Next colonoscopy in 10 years.

## 2014-04-10 ENCOUNTER — Encounter: Payer: Self-pay | Admitting: Gastroenterology

## 2014-04-11 NOTE — Telephone Encounter (Signed)
Called. VM not set up yet. Mailing a letter to call.

## 2014-04-11 NOTE — Telephone Encounter (Signed)
APPT MADE AND ON RECALL LIST  °

## 2014-04-11 NOTE — Telephone Encounter (Signed)
Pt called and is aware of results.  

## 2014-07-06 ENCOUNTER — Inpatient Hospital Stay: Payer: BLUE CROSS/BLUE SHIELD | Admitting: Gastroenterology

## 2014-07-06 HISTORY — PX: COLOSTOMY: SHX63

## 2016-01-17 ENCOUNTER — Encounter: Payer: Self-pay | Admitting: Internal Medicine

## 2016-02-19 ENCOUNTER — Inpatient Hospital Stay (HOSPITAL_COMMUNITY)
Admission: EM | Admit: 2016-02-19 | Discharge: 2016-02-25 | DRG: 871 | Disposition: A | Discharge status: Home / Self Care | Payer: BLUE CROSS/BLUE SHIELD | Attending: Internal Medicine | Admitting: Internal Medicine

## 2016-02-19 ENCOUNTER — Encounter (HOSPITAL_COMMUNITY): Payer: Self-pay | Admitting: Emergency Medicine

## 2016-02-19 ENCOUNTER — Emergency Department (HOSPITAL_COMMUNITY): Payer: BLUE CROSS/BLUE SHIELD

## 2016-02-19 DIAGNOSIS — G4733 Obstructive sleep apnea (adult) (pediatric): Secondary | ICD-10-CM

## 2016-02-19 DIAGNOSIS — A4102 Sepsis due to Methicillin resistant Staphylococcus aureus: Secondary | ICD-10-CM | POA: Diagnosis present

## 2016-02-19 DIAGNOSIS — Z87891 Personal history of nicotine dependence: Secondary | ICD-10-CM | POA: Diagnosis not present

## 2016-02-19 DIAGNOSIS — Z880 Allergy status to penicillin: Secondary | ICD-10-CM

## 2016-02-19 DIAGNOSIS — E876 Hypokalemia: Secondary | ICD-10-CM | POA: Diagnosis not present

## 2016-02-19 DIAGNOSIS — Z888 Allergy status to other drugs, medicaments and biological substances status: Secondary | ICD-10-CM

## 2016-02-19 DIAGNOSIS — Z881 Allergy status to other antibiotic agents status: Secondary | ICD-10-CM

## 2016-02-19 DIAGNOSIS — D6489 Other specified anemias: Secondary | ICD-10-CM | POA: Diagnosis present

## 2016-02-19 DIAGNOSIS — Z885 Allergy status to narcotic agent status: Secondary | ICD-10-CM

## 2016-02-19 DIAGNOSIS — Z9989 Dependence on other enabling machines and devices: Secondary | ICD-10-CM

## 2016-02-19 DIAGNOSIS — IMO0002 Reserved for concepts with insufficient information to code with codable children: Secondary | ICD-10-CM | POA: Diagnosis present

## 2016-02-19 DIAGNOSIS — Z933 Colostomy status: Secondary | ICD-10-CM | POA: Diagnosis not present

## 2016-02-19 DIAGNOSIS — I10 Essential (primary) hypertension: Secondary | ICD-10-CM | POA: Diagnosis present

## 2016-02-19 DIAGNOSIS — Z09 Encounter for follow-up examination after completed treatment for conditions other than malignant neoplasm: Secondary | ICD-10-CM

## 2016-02-19 DIAGNOSIS — Z79899 Other long term (current) drug therapy: Secondary | ICD-10-CM

## 2016-02-19 DIAGNOSIS — K651 Peritoneal abscess: Secondary | ICD-10-CM | POA: Diagnosis present

## 2016-02-19 DIAGNOSIS — D649 Anemia, unspecified: Secondary | ICD-10-CM | POA: Diagnosis present

## 2016-02-19 DIAGNOSIS — Z6841 Body Mass Index (BMI) 40.0 and over, adult: Secondary | ICD-10-CM

## 2016-02-19 DIAGNOSIS — L0291 Cutaneous abscess, unspecified: Secondary | ICD-10-CM

## 2016-02-19 HISTORY — DX: Personal history of other diseases of the digestive system: Z87.19

## 2016-02-19 HISTORY — DX: Cardiac murmur, unspecified: R01.1

## 2016-02-19 HISTORY — DX: Hyperlipidemia, unspecified: E78.5

## 2016-02-19 HISTORY — DX: Dependence on other enabling machines and devices: Z99.89

## 2016-02-19 HISTORY — DX: Chronic obstructive pulmonary disease, unspecified: J44.9

## 2016-02-19 HISTORY — DX: Restless legs syndrome: G25.81

## 2016-02-19 HISTORY — DX: Depression, unspecified: F32.A

## 2016-02-19 HISTORY — DX: Major depressive disorder, single episode, unspecified: F32.9

## 2016-02-19 HISTORY — DX: Obstructive sleep apnea (adult) (pediatric): G47.33

## 2016-02-19 HISTORY — DX: Unspecified osteoarthritis, unspecified site: M19.90

## 2016-02-19 HISTORY — DX: Gastro-esophageal reflux disease without esophagitis: K21.9

## 2016-02-19 HISTORY — DX: Anemia, unspecified: D64.9

## 2016-02-19 HISTORY — DX: Disorder of kidney and ureter, unspecified: N28.9

## 2016-02-19 LAB — BASIC METABOLIC PANEL
Anion gap: 10 (ref 5–15)
BUN: 18 mg/dL (ref 6–20)
CO2: 25 mmol/L (ref 22–32)
Calcium: 9 mg/dL (ref 8.9–10.3)
Chloride: 100 mmol/L — ABNORMAL LOW (ref 101–111)
Creatinine, Ser: 0.78 mg/dL (ref 0.44–1.00)
GFR calc non Af Amer: >60 mL/min (ref >60)
Glucose, Bld: 101 mg/dL — ABNORMAL HIGH (ref 65–99)
Potassium: 3.7 mmol/L (ref 3.5–5.1)
Sodium: 135 mmol/L (ref 135–145)

## 2016-02-19 LAB — URINALYSIS, ROUTINE W REFLEX MICROSCOPIC
BILIRUBIN URINE: NEGATIVE
GLUCOSE, UA: NEGATIVE mg/dL
HGB URINE DIPSTICK: NEGATIVE
Ketones, ur: NEGATIVE mg/dL
LEUKOCYTES UA: NEGATIVE
NITRITE: NEGATIVE
PROTEIN: NEGATIVE mg/dL
Specific Gravity, Urine: 1.025 (ref 1.005–1.030)
pH: 5 (ref 5.0–8.0)

## 2016-02-19 LAB — CBC WITH DIFFERENTIAL/PLATELET
BASOS ABS: 0 10*3/uL (ref 0.0–0.1)
Basophils Relative: 0 %
Eosinophils Absolute: 0.2 10*3/uL (ref 0.0–0.7)
Eosinophils Relative: 1 %
HEMATOCRIT: 33.6 % — AB (ref 36.0–46.0)
Hemoglobin: 10.8 g/dL — ABNORMAL LOW (ref 12.0–15.0)
Lymphocytes Relative: 10 %
Lymphs Abs: 2 10*3/uL (ref 0.7–4.0)
MCH: 26.7 pg (ref 26.0–34.0)
MCHC: 32.1 g/dL (ref 30.0–36.0)
MCV: 83 fL (ref 78.0–100.0)
Monocytes Absolute: 1.8 10*3/uL — ABNORMAL HIGH (ref 0.1–1.0)
Monocytes Relative: 9 %
NEUTROS ABS: 16.6 10*3/uL — AB (ref 1.7–7.7)
NEUTROS PCT: 80 %
Platelets: 530 10*3/uL — ABNORMAL HIGH (ref 150–400)
RBC: 4.05 MIL/uL (ref 3.87–5.11)
RDW: 14.4 % (ref 11.5–15.5)
WBC: 20.6 10*3/uL — ABNORMAL HIGH (ref 4.0–10.5)

## 2016-02-19 MED ORDER — ONDANSETRON HCL 4 MG/2ML IJ SOLN
4.0000 mg | Freq: Four times a day (QID) | INTRAMUSCULAR | Status: DC | PRN
Start: 2016-02-19 — End: 2016-02-25
  Administered 2016-02-23 (×2): 4 mg via INTRAVENOUS
  Filled 2016-02-19 (×2): qty 2

## 2016-02-19 MED ORDER — FENTANYL CITRATE (PF) 100 MCG/2ML IJ SOLN
100.0000 ug | Freq: Once | INTRAMUSCULAR | Status: AC
  Administered 2016-02-19: 100 ug via INTRAVENOUS
  Filled 2016-02-19: qty 2

## 2016-02-19 MED ORDER — ALBUTEROL SULFATE (2.5 MG/3ML) 0.083% IN NEBU: 3.0000 mL | INHALATION_SOLUTION | Freq: Four times a day (QID) | RESPIRATORY_TRACT | Status: DC | PRN

## 2016-02-19 MED ORDER — IOPAMIDOL (ISOVUE-300) INJECTION 61%
100.0000 mL | Freq: Once | INTRAVENOUS | Status: AC | PRN
  Administered 2016-02-19: 100 mL via INTRAVENOUS

## 2016-02-19 MED ORDER — PIPERACILLIN-TAZOBACTAM 3.375 G IVPB 30 MIN
3.3750 g | Freq: Once | INTRAVENOUS | Status: AC
  Administered 2016-02-19: 3.375 g via INTRAVENOUS
  Filled 2016-02-19: qty 50

## 2016-02-19 MED ORDER — IOPAMIDOL (ISOVUE-300) INJECTION 61%
INTRAVENOUS | Status: AC
  Filled 2016-02-19: qty 30

## 2016-02-19 MED ORDER — VENLAFAXINE HCL ER 150 MG PO CP24
150.0000 mg | ORAL_CAPSULE | Freq: Every day | ORAL | Status: DC
  Administered 2016-02-20 – 2016-02-25 (×6): 150 mg via ORAL
  Filled 2016-02-19 (×6): qty 1

## 2016-02-19 MED ORDER — SODIUM CHLORIDE 0.9 % IV BOLUS (SEPSIS)
500.0000 mL | Freq: Once | INTRAVENOUS | Status: AC
  Administered 2016-02-19: 500 mL via INTRAVENOUS

## 2016-02-19 MED ORDER — ATORVASTATIN CALCIUM 10 MG PO TABS
10.0000 mg | ORAL_TABLET | Freq: Every day | ORAL | Status: DC
  Administered 2016-02-19 – 2016-02-24 (×6): 10 mg via ORAL
  Filled 2016-02-19 (×6): qty 1

## 2016-02-19 MED ORDER — ACETAMINOPHEN 650 MG RE SUPP: 650.0000 mg | Freq: Four times a day (QID) | RECTAL | Status: DC | PRN

## 2016-02-19 MED ORDER — PANTOPRAZOLE SODIUM 40 MG PO TBEC
40.0000 mg | DELAYED_RELEASE_TABLET | Freq: Every day | ORAL | Status: DC
  Administered 2016-02-20 – 2016-02-25 (×6): 40 mg via ORAL
  Filled 2016-02-19 (×6): qty 1

## 2016-02-19 MED ORDER — PRAMIPEXOLE DIHYDROCHLORIDE 0.125 MG PO TABS
0.5000 mg | ORAL_TABLET | Freq: Every day | ORAL | Status: DC
  Administered 2016-02-19 – 2016-02-24 (×4): 0.5 mg via ORAL
  Filled 2016-02-19 (×8): qty 4

## 2016-02-19 MED ORDER — MORPHINE SULFATE (PF) 2 MG/ML IV SOLN
2.0000 mg | INTRAVENOUS | Status: DC | PRN
  Administered 2016-02-19 – 2016-02-20 (×7): 2 mg via INTRAVENOUS
  Filled 2016-02-19 (×7): qty 1

## 2016-02-19 MED ORDER — NEBIVOLOL HCL 5 MG PO TABS
5.0000 mg | ORAL_TABLET | Freq: Every day | ORAL | Status: DC
  Administered 2016-02-20 – 2016-02-25 (×6): 5 mg via ORAL
  Filled 2016-02-19 (×6): qty 1

## 2016-02-19 MED ORDER — LOSARTAN POTASSIUM 50 MG PO TABS
50.0000 mg | ORAL_TABLET | Freq: Every day | ORAL | Status: DC
  Administered 2016-02-20 – 2016-02-23 (×4): 50 mg via ORAL
  Filled 2016-02-19 (×4): qty 1

## 2016-02-19 MED ORDER — PRAMIPEXOLE DIHYDROCHLORIDE 0.125 MG PO TABS
0.2500 mg | ORAL_TABLET | Freq: Every evening | ORAL | Status: DC | PRN
  Administered 2016-02-24: 0.25 mg via ORAL

## 2016-02-19 MED ORDER — MOMETASONE FURO-FORMOTEROL FUM 200-5 MCG/ACT IN AERO
2.0000 | INHALATION_SPRAY | Freq: Two times a day (BID) | RESPIRATORY_TRACT | Status: DC
  Administered 2016-02-20 – 2016-02-25 (×7): 2 via RESPIRATORY_TRACT
  Filled 2016-02-19 (×2): qty 8.8

## 2016-02-19 MED ORDER — PIPERACILLIN-TAZOBACTAM 3.375 G IVPB
3.3750 g | Freq: Three times a day (TID) | INTRAVENOUS | Status: DC
  Administered 2016-02-20 – 2016-02-23 (×10): 3.375 g via INTRAVENOUS
  Filled 2016-02-19 (×12): qty 50

## 2016-02-19 MED ORDER — LACTATED RINGERS IV SOLN
INTRAVENOUS | Status: DC
  Administered 2016-02-19: 23:00:00 via INTRAVENOUS

## 2016-02-19 MED ORDER — ONDANSETRON HCL 4 MG PO TABS: 4.0000 mg | ORAL_TABLET | Freq: Four times a day (QID) | ORAL | Status: DC | PRN

## 2016-02-19 MED ORDER — ACETAMINOPHEN 325 MG PO TABS
650.0000 mg | ORAL_TABLET | Freq: Four times a day (QID) | ORAL | Status: DC | PRN
  Administered 2016-02-19: 650 mg via ORAL
  Filled 2016-02-19: qty 2

## 2016-02-19 NOTE — ED Notes (Signed)
Patient left with [facility] at this time, report given to Bern, RN [id number], all questions answered

## 2016-02-19 NOTE — ED Triage Notes (Signed)
Patient complaining of lower left quadrant abdominal pain "for a couple of months." States she was sent here by PCP for WBC of 21.1. States "it feels just like it did when I had a perforated colon before."

## 2016-02-19 NOTE — H&P (Signed)
History and Physical    Belma Daddario [id number] DOB: July 27, 1960 DOA: 02/19/2016  PCP: Moshe Cipro, MD Consultants:  None Patient coming from: home - lives with husband; NOK: husband, [phone]  Chief Complaint: abdominal pain  HPI: [name] [name] is a 56 y.o. 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  Started running a fever over the weekend.  Went to PCP and they sent her here.  Pain is in LLQ and radiates around to the back.  Fever to 101.7, last fever was this AM (100.4).  No n/v.  Normal BMs, last BM was just now.  No weight changes.     ED Course: Per Dr. Alvino Chapel: Patient with left-sided abdominal pain for last couple months. Worse recently. Has had previous complicated intra-abdominal abscesses. Reportedly had negative CT scan in September. No white count is elevated has left-sided intra-abdominal abscess. Discussed with Dr. Koleen Nimrod from interventional radiology. Patient likely be best served with transfer to [facility] for admission. Will admit to internal medicine   Review of Systems: As per HPI; otherwise 10 point review of systems reviewed and negative.   Ambulatory Status:  Ambulates without assistance  Past Medical History:  Diagnosis Date  . Asthma   . Diarrhea age 31  . Diverticulitis SEP 2015   PERFORATED, SIGMOID COLON  . HTN (hypertension)   . Sleep apnea [id number]   CPAP, uncertain setting    Past Surgical History:  Procedure Laterality Date  . bladder surgery     bladder tack  . CERVICAL CONE BIOPSY    . COLONOSCOPY N/A 04/05/2014   [id number] HH/moderate diverticulosis  . 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 ESOPHAGOGASTRODUODENOSCOPY N/A 04/05/2014   [id number] gastric polyps/mild erosive gastritis  . RECTOCELE REPAIR    . tubal ligation      Social History   Social History  . Marital status:  Married    Spouse name: N/A  . Number of children: N/A  . Years of education: N/A   Occupational History  . previously medical office assistant    Social History Main Topics  . Smoking status: Former Smoker    Quit date: 05/21/1997  . Smokeless tobacco: Never Used  . Alcohol use No  . Drug use: No  . Sexual activity: Not on file   Other Topics Concern  . Not on file   Social History Narrative   Mother passed 5 yrs ago.     Allergies  Allergen Reactions  . Penicillins Shortness Of Breath  . Cortisone Hives  . Crestor [Rosuvastatin Calcium] Other (See Comments)    "started messing up my liver"  . Dilaudid [Hydromorphone Hcl] Other (See Comments)    Respiratory depression  . Neosporin [Neomycin-Bacitracin Zn-Polymyx] Hives  . Vancomycin Nausea And Vomiting    Family History  Problem Relation Age of Onset  . Diverticulitis Mother   . Diverticulitis Maternal Aunt   . Diverticulitis Maternal Grandmother     Prior to Admission medications   Medication Sig Start Date End Date Taking? Authorizing Provider  albuterol (PROVENTIL HFA;VENTOLIN HFA) 108 (90 BASE) MCG/ACT inhaler Inhale into the lungs every 6 (six) hours as needed for wheezing or shortness of breath.   Yes Historical Provider, MD  atorvastatin (LIPITOR) 10 MG tablet Take 10 mg by mouth at bedtime.    Yes Historical Provider, MD  Calcium Carbonate-Vitamin D 600-400 MG-UNIT tablet Take  1 tablet by mouth daily.    Yes Historical Provider, MD  Fluticasone-Salmeterol (ADVAIR) 250-50 MCG/DOSE AEPB Inhale 1 puff into the lungs 2 (two) times daily.   Yes Historical Provider, MD  losartan (COZAAR) 50 MG tablet Take 50 mg by mouth daily.   Yes Historical Provider, MD  nebivolol (BYSTOLIC) 5 MG tablet Take 5 mg by mouth daily.   Yes Historical Provider, MD  oxyCODONE-acetaminophen (PERCOCET/ROXICET) 5-325 MG tablet Take 1 tablet by mouth every 6 (six) hours as needed for moderate pain.  02/09/16  Yes Historical Provider, MD    pantoprazole (PROTONIX) 40 MG tablet Take 40 mg by mouth daily.   Yes Historical Provider, MD  polyethylene glycol powder (GLYCOLAX/MIRALAX) powder MIX 1 HEAPING TABLESPOONFUL IN 8 OUNCES OF A LIQUID AND DRINK BID PRF CONSTIPATION 02/09/16  Yes Historical Provider, MD  pramipexole (MIRAPEX) 0.5 MG tablet Take 0.25 mg by mouth at bedtime.    Yes Historical Provider, MD  pramipexole (MIRAPEX) 0.5 MG tablet Take 0.5 mg by mouth at bedtime.    Yes Historical Provider, MD  venlafaxine XR (EFFEXOR-XR) 150 MG 24 hr capsule Take 150 mg by mouth daily with breakfast.   Yes Historical Provider, MD    Physical Exam: Vitals:   02/19/16 1824 02/19/16 2001 02/19/16 2004 02/19/16 2054  BP:  121/70  116/71  Pulse:  90  (!) 101  Resp:  18  18  Temp: 98.8 F (37.1 C)  99.1 F (37.3 C) 100.3 F (37.9 C)  TempSrc:    Oral  SpO2:  99%  97%  Weight:      Height:         General:  Appears uncomfortable but is in NAD Eyes:  PERRL, EOMI, normal lids, iris ENT:  grossly normal hearing, lips & tongue, mmm Neck:  no LAD, masses or thyromegaly Cardiovascular:  RRR, no m/r/g. No LE edema.  Respiratory:  CTA bilaterally, no w/r/r. Normal respiratory effort. Abdomen:  Colostomy in place, appears C/D/I; diffuse left-sided abdominal TTP Skin:  no rash or induration seen on limited exam Musculoskeletal:  grossly normal tone BUE/BLE, good ROM, no bony abnormality Psychiatric:  grossly normal mood and affect, speech fluent and appropriate, AOx3 Neurologic:  CN 2-12 grossly intact, moves all extremities in coordinated fashion, sensation intact  Labs on Admission: I have personally reviewed following labs and imaging studies  CBC:  Recent Labs Lab 02/19/16 1540  WBC 20.6*  NEUTROABS 16.6*  HGB 10.8*  HCT 33.6*  MCV 83.0  PLT 123456*   Basic Metabolic Panel:  Recent Labs Lab 02/19/16 1540  NA 135  K 3.7  CL 100*  CO2 25  GLUCOSE 101*  BUN 18  CREATININE 0.78  CALCIUM 9.0   GFR: Estimated  Creatinine Clearance: 95.2 mL/min (by C-G formula based on SCr of 0.78 mg/dL). Liver Function Tests: No results for input(s): AST, ALT, ALKPHOS, BILITOT, PROT, ALBUMIN in the last 168 hours. No results for input(s): LIPASE, AMYLASE in the last 168 hours. No results for input(s): AMMONIA in the last 168 hours. Coagulation Profile: No results for input(s): INR, PROTIME in the last 168 hours. Cardiac Enzymes: No results for input(s): CKTOTAL, CKMB, CKMBINDEX, TROPONINI in the last 168 hours. BNP (last 3 results) No results for input(s): PROBNP in the last 8760 hours. HbA1C: No results for input(s): HGBA1C in the last 72 hours. CBG: No results for input(s): GLUCAP in the last 168 hours. Lipid Profile: No results for input(s): CHOL, HDL, LDLCALC, TRIG, CHOLHDL, LDLDIRECT in  the last 72 hours. Thyroid Function Tests: No results for input(s): TSH, T4TOTAL, FREET4, T3FREE, THYROIDAB in the last 72 hours. Anemia Panel: No results for input(s): VITAMINB12, FOLATE, FERRITIN, TIBC, IRON, RETICCTPCT in the last 72 hours. Urine analysis:    Component Value Date/Time   COLORURINE AMBER (A) 02/19/2016 1636   APPEARANCEUR TURBID (A) 02/19/2016 1636   LABSPEC 1.025 02/19/2016 1636   PHURINE 5.0 02/19/2016 1636   GLUCOSEU NEGATIVE 02/19/2016 1636   HGBUR NEGATIVE 02/19/2016 1636   BILIRUBINUR NEGATIVE 02/19/2016 1636   KETONESUR NEGATIVE 02/19/2016 1636   PROTEINUR NEGATIVE 02/19/2016 1636   NITRITE NEGATIVE 02/19/2016 1636   LEUKOCYTESUR NEGATIVE 02/19/2016 1636    Creatinine Clearance: Estimated Creatinine Clearance: 95.2 mL/min (by C-G formula based on SCr of 0.78 mg/dL).  Sepsis Labs: @LABRCNTIP (procalcitonin:4,lacticidven:4) )No results found for this or any previous visit (from the past 240 hour(s)).   Radiological Exams on Admission: Ct Abdomen Pelvis W Contrast  Result Date: 02/19/2016 CLINICAL DATA:  Left abdominal pain for several months, worsening last 2-3 days, elevated white  blood cell count, history of perforated colon and diverticulitis. EXAM: CT ABDOMEN AND PELVIS WITH CONTRAST TECHNIQUE: Multidetector CT imaging of the abdomen and pelvis was performed using the standard protocol following bolus administration of intravenous contrast. CONTRAST:  1102mL ISOVUE-300 IOPAMIDOL (ISOVUE-300) INJECTION 61% COMPARISON:  None. FINDINGS: Lower chest: No acute abnormality. Hepatobiliary: No focal liver abnormality is seen. Probable punctate stone within the gallbladder. No gallbladder wall thickening or pericholecystic fluid. No bile duct dilatation seen. Pancreas: Unremarkable. No pancreatic ductal dilatation or surrounding inflammatory changes. Spleen: Normal in size without focal abnormality. Adrenals/Urinary Tract: Adrenal glands appear normal. Benign appearing cyst within the lateral cortex of the left kidney measures 2.6 x 2.5 cm. Kidneys otherwise unremarkable bilaterally without stone or hydronephrosis. No ureteral or bladder calculi identified. Bladder is unremarkable, decompressed. 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 No dilated large or small bowel loops. Appendix is normal. Vascular/Lymphatic: Scattered mild atherosclerotic changes of the normal caliber abdominal aorta. No enlarged lymph nodes appreciated in the abdomen or pelvis. Reproductive: Uterus and bilateral adnexa are unremarkable. Other: There is a complex intra-abdominal fluid collection within the left paracolic gutter, measuring approximately 12 cm craniocaudal dimension and 2.5 cm greatest thickness, with surrounding inflammatory fluid stranding. No free intraperitoneal air appreciated. Musculoskeletal: Ill-defined edema within the subcutaneous soft tissues of the left lateral abdominal wall, possibly reactive to and/or contiguous with the fluid collection in the underlying paracolic gutter. No acute or suspicious osseous finding. IMPRESSION: 1.  Complex intra-abdominal fluid collection within the left paracolic gutter, with a walled-off appearance, measuring approximately 12 cm craniocaudal dimension and 2.5 cm greatest thickness, with surrounding inflammatory fluid stranding, presumed abscess collection. No obvious source for the presumed abscess. No air is seen within the collection to confirm abscess or to suggest fistulous connection with adjacent bowel. 2. Ill-defined edema/fluid stranding within the subcutaneous soft tissues of the left lateral abdominal wall, and thickening/edema of the intervening abdominal wall musculature, almost certainly reactive to and/or contiguous with the presumed abscess collection in the underlying paracolic gutter. No circumscribed fluid collection is seen within these superficial soft tissues. 3. Surgical changes of previous partial colectomy with left anterior abdominal wall colostomy creation. No evidence of obstruction or other surgical complicating feature at the colostomy site. 4. Aortic atherosclerosis. Probable punctate gallstone within the otherwise normal-appearing gallbladder. Additional chronic/incidental findings detailed above. These results were called by telephone at the time  of interpretation on 02/19/2016 at 5:59 pm to Dr. Davonna Belling , who verbally acknowledged these results. Electronically Signed   By: Franki Cabot M.D.   On: 02/19/2016 18:06    EKG: not done  Assessment/Plan Principal Problem:   Abdominal abscess (HCC) Active Problems:   OSA on CPAP   Essential hypertension   Normocytic anemia   Abdominal abscess -Patient with a remote h/o perforated diverticulitis which eventually led to colostomy -Now presenting with acute on chronic abdominal pain, fever -Found to have a large 12 cm left paracolic gutter abscess -Uncertain etiology - possibly from colostomy? Or has h/o C diff and possibly infection walled off? -After discussion between [facility] ER physician and [facility] IR, decision  was made to transfer to [facility] for IR drainage in the AM -For now, will cover with Zosyn for intra-abdominal infection -IR consult requested, may need telephone call in AM -Pain control with morphine -Blood cultures prn fever >101 -Clear liquids for now, NPO after midnight -She is not taking blood thinners -She may also benefit from General Surgery consultation  Anemia -Hgb 10.8 (11.3 on 02/09/16, 12.1 on 01/24/16) -She may have slow oozing from the abscess area -Will monitor Hgb  OSA -No CPAP for now due to increased abdominal pressure from CPAP  HTN -Continue Cozaar and Bystolic  DVT prophylaxis: SCDs until after procedure Code Status:  Full - confirmed with patient/family Family Communication: Daughter present throughout evaluation  Disposition Plan:  Home once clinically improved Consults called: IR (consult placed)  Admission status: Admit - It is my clinical opinion that admission to [facility] is reasonable and necessary because this patient will require at least 2 midnights in the hospital to treat this condition based on the medical complexity of the problems presented.  Given the aforementioned information, the predictability of an adverse outcome is felt to be significant.    Karmen Bongo MD [facility]  If 7PM-7AM, please contact night-coverage [email or web address] Password [email or web address]  02/19/2016, 10:01 PM

## 2016-02-19 NOTE — Progress Notes (Signed)
Pharmacy Antibiotic Note  [name] [name] is a 56 y.o. female admitted on 02/19/2016 with intra-abdominal infection.  Pharmacy has been consulted for Zosyn dosing. Patient transferred from [facility], but it does not appear any antibiotics were given prior to transfer. Renal function normal.  Plan: Zosyn 3.375g IV q8h (4 hour infusion).  Height: 5' (152.4 cm) Weight: 263 lb (119.3 kg) IBW/kg (Calculated) : 45.5  Temp (24hrs), Avg:99.4 F (37.4 C), Min:98.8 F (37.1 C), Max:100.3 F (37.9 C)   Recent Labs Lab 02/19/16 1540  WBC 20.6*  CREATININE 0.78    Estimated Creatinine Clearance: 95.2 mL/min (by C-G formula based on SCr of 0.78 mg/dL).    Allergies  Allergen Reactions  . Penicillins Shortness Of Breath  . Cortisone Hives  . Crestor [Rosuvastatin Calcium] Other (See Comments)    "started messing up my liver"  . Dilaudid [Hydromorphone Hcl] Other (See Comments)    Respiratory depression  . Neosporin [Neomycin-Bacitracin Zn-Polymyx] Hives  . Vancomycin Nausea And Vomiting    Antimicrobials this admission: Zosyn 1/15 >>   Dose adjustments this admission: n/a  Microbiology results: Nothing drawn  Thank you for allowing pharmacy to be a part of this patient's care.   Terence Googe D. Egan Berkheimer, PharmD, BCPS Clinical Pharmacist Pager: [phone] 02/19/2016 10:11 PM

## 2016-02-19 NOTE — ED Provider Notes (Signed)
[facility] DEPT Provider Note   CSN: [id number] Arrival date & time: 02/19/16  1426     History   Chief Complaint Chief Complaint  Patient presents with  . Abdominal Pain    HPI [name] [name] is a 56 y.o. female.  HPI Patient presents with abdominal pain and elevated white count with fevers. She has had chronic abdominal pain since compensated diverticulitis a couple years ago. Had incompetent by abscesses and has a colostomy now. She's had chronic pain and pain got worse recently. Has white count of 21,000 on outpatient labs. Now has worsening left lower quadrant and left-sided abdominal pain. Pain with bowel movements. Has had fevers. No vomiting. No dysuria. No cough. States it feels like when she had a perforated bowel.   Past Medical History:  Diagnosis Date  . Asthma   . Diarrhea age 63  . Diverticulitis SEP 2015   PERFORATED, SIGMOID COLON  . HTN (hypertension)   . Sleep apnea 2015    Patient Active Problem List   Diagnosis Date Noted  . Diarrhea 03/23/2014  . Dyspepsia 03/23/2014  . Diverticulitis 10/05/2013    Past Surgical History:  Procedure Laterality Date  . bladder surgery    . CERVICAL CONE BIOPSY    . COLONOSCOPY N/A 04/05/2014   [id number] HH/moderate diverticulosis  . COLOSTOMY    . ESOPHAGOGASTRODUODENOSCOPY N/A 04/05/2014   [id number] gastric polyps/mild erosive gastritis  . RECTOCELE REPAIR    . tubal ligation      OB History    No data available       Home Medications    Prior to Admission medications   Medication Sig Start Date End Date Taking? Authorizing Provider  albuterol (PROVENTIL HFA;VENTOLIN HFA) 108 (90 BASE) MCG/ACT inhaler Inhale into the lungs every 6 (six) hours as needed for wheezing or shortness of breath.   Yes Historical Provider, MD  atorvastatin (LIPITOR) 10 MG tablet Take 10 mg by mouth at bedtime.    Yes Historical Provider, MD  Calcium Carbonate-Vitamin D 600-400 MG-UNIT tablet Take 1 tablet by mouth daily.     Yes Historical Provider, MD  Fluticasone-Salmeterol (ADVAIR) 250-50 MCG/DOSE AEPB Inhale 1 puff into the lungs 2 (two) times daily.   Yes Historical Provider, MD  losartan (COZAAR) 50 MG tablet Take 50 mg by mouth daily.   Yes Historical Provider, MD  nebivolol (BYSTOLIC) 5 MG tablet Take 5 mg by mouth daily.   Yes Historical Provider, MD  oxyCODONE-acetaminophen (PERCOCET/ROXICET) 5-325 MG tablet Take 1 tablet by mouth every 6 (six) hours as needed for moderate pain.  02/09/16  Yes Historical Provider, MD  pantoprazole (PROTONIX) 40 MG tablet Take 40 mg by mouth daily.   Yes Historical Provider, MD  polyethylene glycol powder (GLYCOLAX/MIRALAX) powder MIX 1 HEAPING TABLESPOONFUL IN 8 OUNCES OF A LIQUID AND DRINK BID PRF CONSTIPATION 02/09/16  Yes Historical Provider, MD  pramipexole (MIRAPEX) 0.5 MG tablet Take 0.25 mg by mouth at bedtime.    Yes Historical Provider, MD  pramipexole (MIRAPEX) 0.5 MG tablet Take 0.5 mg by mouth at bedtime.    Yes Historical Provider, MD  venlafaxine XR (EFFEXOR-XR) 150 MG 24 hr capsule Take 150 mg by mouth daily with breakfast.   Yes Historical Provider, MD    Family History Family History  Problem Relation Age of Onset  . Diverticulitis Mother   . Diverticulitis Maternal Aunt   . Diverticulitis Maternal Grandmother     Social History Social History  Substance Use Topics  . Smoking  status: Former Smoker    Quit date: 05/21/1997  . Smokeless tobacco: Never Used  . Alcohol use No     Allergies   Penicillins; Cortisone; Crestor [rosuvastatin calcium]; Dilaudid [hydromorphone hcl]; Neosporin [neomycin-bacitracin zn-polymyx]; and Vancomycin   Review of Systems Review of Systems  Constitutional: Positive for chills and fever. Negative for appetite change.  HENT: Negative for congestion.   Respiratory: Negative for shortness of breath.   Cardiovascular: Negative for chest pain.  Gastrointestinal: Positive for abdominal pain and constipation. Negative for  abdominal distention and diarrhea.  Endocrine: Negative for polyuria.  Genitourinary: Positive for flank pain.  Musculoskeletal: Negative for back pain.  Skin: Negative for wound.  Neurological: Negative for numbness.  Hematological: Negative for adenopathy.  Psychiatric/Behavioral: Negative for confusion.     Physical Exam Updated Vital Signs BP 113/76   Pulse 90   Temp 98.8 F (37.1 C)   Resp 18   Ht 5' (1.524 m)   Wt 263 lb (119.3 kg)   LMP 08/04/2013   SpO2 99%   BMI 51.36 kg/m   Physical Exam  Constitutional: She appears well-developed.  HENT:  Head: Normocephalic.  Eyes: Pupils are equal, round, and reactive to light.  Neck: No JVD present.  Cardiovascular: Normal rate.   Pulmonary/Chest: Effort normal.  Abdominal: There is tenderness.  Colostomy left midabdomen. Moderate tenderness to left lower left midabdomen. No hernias palpated. Mild distention.  Musculoskeletal: She exhibits no edema.  Neurological: She is alert.  Skin: Skin is warm. Capillary refill takes less than 2 seconds.  Psychiatric: She has a normal mood and affect.     ED Treatments / Results  Labs (all labs ordered are listed, but only abnormal results are displayed) Labs Reviewed  CBC WITH DIFFERENTIAL/PLATELET - Abnormal; Notable for the following:       Result Value   WBC 20.6 (*)    Hemoglobin 10.8 (*)    HCT 33.6 (*)    Platelets 530 (*)    Neutro Abs 16.6 (*)    Monocytes Absolute 1.8 (*)    All other components within normal limits  BASIC METABOLIC PANEL - Abnormal; Notable for the following:    Chloride 100 (*)    Glucose, Bld 101 (*)    All other components within normal limits  URINALYSIS, ROUTINE W REFLEX MICROSCOPIC - Abnormal; Notable for the following:    Color, Urine AMBER (*)    APPearance TURBID (*)    Bacteria, UA FEW (*)    All other components within normal limits    EKG  EKG Interpretation None       Radiology Ct Abdomen Pelvis W Contrast  Result  Date: 02/19/2016 CLINICAL DATA:  Left abdominal pain for several months, worsening last 2-3 days, elevated white blood cell count, history of perforated colon and diverticulitis. EXAM: CT ABDOMEN AND PELVIS WITH CONTRAST TECHNIQUE: Multidetector CT imaging of the abdomen and pelvis was performed using the standard protocol following bolus administration of intravenous contrast. CONTRAST:  172mL ISOVUE-300 IOPAMIDOL (ISOVUE-300) INJECTION 61% COMPARISON:  None. FINDINGS: Lower chest: No acute abnormality. Hepatobiliary: No focal liver abnormality is seen. Probable punctate stone within the gallbladder. No gallbladder wall thickening or pericholecystic fluid. No bile duct dilatation seen. Pancreas: Unremarkable. No pancreatic ductal dilatation or surrounding inflammatory changes. Spleen: Normal in size without focal abnormality. Adrenals/Urinary Tract: Adrenal glands appear normal. Benign appearing cyst within the lateral cortex of the left kidney measures 2.6 x 2.5 cm. Kidneys otherwise unremarkable bilaterally without stone or hydronephrosis.  No ureteral or bladder calculi identified. Bladder is unremarkable, decompressed. 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 No dilated large or small bowel loops. Appendix is normal. Vascular/Lymphatic: Scattered mild atherosclerotic changes of the normal caliber abdominal aorta. No enlarged lymph nodes appreciated in the abdomen or pelvis. Reproductive: Uterus and bilateral adnexa are unremarkable. Other: There is a complex intra-abdominal fluid collection within the left paracolic gutter, measuring approximately 12 cm craniocaudal dimension and 2.5 cm greatest thickness, with surrounding inflammatory fluid stranding. No free intraperitoneal air appreciated. Musculoskeletal: Ill-defined edema within the subcutaneous soft tissues of the left lateral abdominal wall, possibly reactive to and/or contiguous with  the fluid collection in the underlying paracolic gutter. No acute or suspicious osseous finding. IMPRESSION: 1. Complex intra-abdominal fluid collection within the left paracolic gutter, with a walled-off appearance, measuring approximately 12 cm craniocaudal dimension and 2.5 cm greatest thickness, with surrounding inflammatory fluid stranding, presumed abscess collection. No obvious source for the presumed abscess. No air is seen within the collection to confirm abscess or to suggest fistulous connection with adjacent bowel. 2. Ill-defined edema/fluid stranding within the subcutaneous soft tissues of the left lateral abdominal wall, and thickening/edema of the intervening abdominal wall musculature, almost certainly reactive to and/or contiguous with the presumed abscess collection in the underlying paracolic gutter. No circumscribed fluid collection is seen within these superficial soft tissues. 3. Surgical changes of previous partial colectomy with left anterior abdominal wall colostomy creation. No evidence of obstruction or other surgical complicating feature at the colostomy site. 4. Aortic atherosclerosis. Probable punctate gallstone within the otherwise normal-appearing gallbladder. Additional chronic/incidental findings detailed above. These results were called by telephone at the time of interpretation on 02/19/2016 at 5:59 pm to Dr. Davonna Belling , who verbally acknowledged these results. Electronically Signed   By: Franki Cabot M.D.   On: 02/19/2016 18:06    Procedures Procedures (including critical care time)  Medications Ordered in ED Medications  iopamidol (ISOVUE-300) 61 % injection (not administered)  sodium chloride 0.9 % bolus 500 mL (0 mLs Intravenous Stopped 02/19/16 1710)  fentaNYL (SUBLIMAZE) injection 100 mcg (100 mcg Intravenous Given 02/19/16 1610)  iopamidol (ISOVUE-300) 61 % injection 100 mL (100 mLs Intravenous Contrast Given 02/19/16 1720)     Initial Impression /  Assessment and Plan / ED Course  I have reviewed the triage vital signs and the nursing notes.  Pertinent labs & imaging results that were available during my care of the patient were reviewed by me and considered in my medical decision making (see chart for details).  Clinical Course     Patient with left-sided abdominal pain for last couple months. Worse recently. Has had previous complicated intra-abdominal abscesses. Reportedly had negative CT scan in September. No white count is elevated has left-sided intra-abdominal abscess. Discussed with Dr. Koleen Nimrod from interventional radiology. Patient likely be best served with transfer to [facility] for admission. Will admit to internal medicine  Final Clinical Impressions(s) / ED Diagnoses   Final diagnoses:  Abdominal abscess [facility])    New Prescriptions New Prescriptions   No medications on file     Davonna Belling, MD 02/19/16 [phone]

## 2016-02-19 NOTE — ED Notes (Signed)
Report given to Presley Raddle, all questions answered. ETA 20 mins

## 2016-02-20 ENCOUNTER — Inpatient Hospital Stay (HOSPITAL_COMMUNITY): Payer: BLUE CROSS/BLUE SHIELD

## 2016-02-20 ENCOUNTER — Encounter (HOSPITAL_COMMUNITY): Payer: Self-pay | Admitting: Physician Assistant

## 2016-02-20 DIAGNOSIS — K651 Peritoneal abscess: Secondary | ICD-10-CM

## 2016-02-20 LAB — CBC
HEMATOCRIT: 29.8 % — AB (ref 36.0–46.0)
Hemoglobin: 9.5 g/dL — ABNORMAL LOW (ref 12.0–15.0)
MCH: 26.4 pg (ref 26.0–34.0)
MCHC: 31.9 g/dL (ref 30.0–36.0)
MCV: 82.8 fL (ref 78.0–100.0)
Platelets: 432 10*3/uL — ABNORMAL HIGH (ref 150–400)
RBC: 3.6 MIL/uL — ABNORMAL LOW (ref 3.87–5.11)
RDW: 14.6 % (ref 11.5–15.5)
WBC: 13.4 10*3/uL — AB (ref 4.0–10.5)

## 2016-02-20 LAB — BASIC METABOLIC PANEL
Anion gap: 9 (ref 5–15)
BUN: 10 mg/dL (ref 6–20)
CHLORIDE: 101 mmol/L (ref 101–111)
CO2: 27 mmol/L (ref 22–32)
Calcium: 8.8 mg/dL — ABNORMAL LOW (ref 8.9–10.3)
Creatinine, Ser: 0.65 mg/dL (ref 0.44–1.00)
GFR calc Af Amer: >60 mL/min (ref >60)
GFR calc non Af Amer: >60 mL/min (ref >60)
GLUCOSE: 100 mg/dL — AB (ref 65–99)
Potassium: 3.4 mmol/L — ABNORMAL LOW (ref 3.5–5.1)
Sodium: 137 mmol/L (ref 135–145)

## 2016-02-20 LAB — PROTIME-INR
INR: 1.23
Prothrombin Time: 15.6 seconds — ABNORMAL HIGH (ref 11.4–15.2)

## 2016-02-20 MED ORDER — MIDAZOLAM HCL 2 MG/2ML IJ SOLN
INTRAMUSCULAR | Status: AC
  Filled 2016-02-20: qty 2

## 2016-02-20 MED ORDER — FENTANYL CITRATE (PF) 100 MCG/2ML IJ SOLN
INTRAMUSCULAR | Status: AC | PRN
  Administered 2016-02-20 (×2): 50 ug via INTRAVENOUS

## 2016-02-20 MED ORDER — MIDAZOLAM HCL 2 MG/2ML IJ SOLN
INTRAMUSCULAR | Status: AC | PRN
  Administered 2016-02-20 (×2): 1 mg via INTRAVENOUS

## 2016-02-20 MED ORDER — LIDOCAINE HCL 1 % IJ SOLN
INTRAMUSCULAR | Status: AC
  Filled 2016-02-20: qty 20

## 2016-02-20 MED ORDER — FENTANYL CITRATE (PF) 100 MCG/2ML IJ SOLN
INTRAMUSCULAR | Status: AC
  Filled 2016-02-20: qty 2

## 2016-02-20 MED ORDER — POTASSIUM CHLORIDE CRYS ER 20 MEQ PO TBCR
40.0000 meq | EXTENDED_RELEASE_TABLET | Freq: Once | ORAL | Status: AC
  Administered 2016-02-20: 40 meq via ORAL
  Filled 2016-02-20: qty 2

## 2016-02-20 MED ORDER — OXYCODONE-ACETAMINOPHEN 5-325 MG PO TABS
1.0000 | ORAL_TABLET | ORAL | Status: DC | PRN
Start: 2016-02-20 — End: 2016-02-25
  Administered 2016-02-20 – 2016-02-23 (×15): 2 via ORAL
  Administered 2016-02-23: 1 via ORAL
  Administered 2016-02-23: 2 via ORAL
  Administered 2016-02-23 (×2): 1 via ORAL
  Administered 2016-02-24 (×2): 2 via ORAL
  Administered 2016-02-24 – 2016-02-25 (×4): 1 via ORAL
  Administered 2016-02-25: 2 via ORAL
  Administered 2016-02-25: 1 via ORAL
  Administered 2016-02-25: 2 via ORAL
  Filled 2016-02-20: qty 1
  Filled 2016-02-20 (×5): qty 2
  Filled 2016-02-20: qty 1
  Filled 2016-02-20: qty 2
  Filled 2016-02-20: qty 1
  Filled 2016-02-20 (×5): qty 2
  Filled 2016-02-20: qty 1
  Filled 2016-02-20: qty 2
  Filled 2016-02-20: qty 1
  Filled 2016-02-20 (×2): qty 2
  Filled 2016-02-20: qty 1
  Filled 2016-02-20: qty 2
  Filled 2016-02-20 (×2): qty 1
  Filled 2016-02-20 (×5): qty 2

## 2016-02-20 MED ORDER — MORPHINE SULFATE (PF) 4 MG/ML IV SOLN
INTRAVENOUS | Status: AC
  Administered 2016-02-20: 2 mg
  Filled 2016-02-20: qty 1

## 2016-02-20 MED FILL — Fentanyl Citrate Preservative Free (PF) Inj 100 MCG/2ML: INTRAMUSCULAR | Qty: 2 | Status: AC

## 2016-02-20 MED FILL — Ondansetron HCl Inj 4 MG/2ML (2 MG/ML): INTRAMUSCULAR | Qty: 2 | Status: AC

## 2016-02-20 NOTE — Progress Notes (Signed)
Patient ID: [name] [name], female   DOB: 12/12/1960, 56 y.o.   MRN: [id number]    PROGRESS NOTE    [name] [name]  [id number] DOB: 11-14-1960 DOA: 02/19/2016  PCP: Moshe Cipro, MD   Brief Narrative:  56 y.o. 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  Started running a fever over the weekend.  Went to PCP and they sent her to ED for evaluation.   Assessment & Plan:  Sepsis secondary to Abdominal abscess - Patient with a remote h/o perforated diverticulitis which eventually led to colostomy - Found to have a large 12 cm left paracolic gutter abscess - Uncertain etiology - possibly from colostomy? Or has h/o C diff and possibly infection walled off? - IR consulted for drainage - pt is now s/p CT drain in LLQ for abd abscess, appreciate IR team assistance  - continue zosyn day #2  Anemia - Hgb 10.8 (11.3 on 02/09/16, 12.1 on 01/24/16) - currently no signs of active bleeding - CBC in AM  OSA - on CPAP  HTN, essential  - Continue Cozaar and Bystolic  Morbid obesity  - Body mass index is 51.36 kg/m  Hypokalemia - supplement and repeat BMP in AM    DVT prophylaxis: SCD's Code Status: Full  Family Communication: Patient at bedside  Disposition Plan: home in 2-3 days   Consultants:   IR  Procedures:   CT guided drain placement in LLQ 1/16  Antimicrobials:   Zosyn 1/15 -->  Subjective: Still with abd discomfort, morphine does not help.   Objective: Vitals:   02/20/16 1331 02/20/16 1335 02/20/16 1339 02/20/16 1343  BP: 105/69 119/66  101/63  Pulse: 92 91 90 93  Resp: 19 17 (!) 22 18  Temp:      TempSrc:      SpO2: 98% (!) 42% 99% 95%  Weight:      Height:        Intake/Output Summary (Last 24 hours) at 02/20/16 1549 Last data filed at 02/20/16 1333  Gross per 24 hour  Intake              500 ml  Output                30 ml  Net              470 ml   Filed Weights   02/19/16 1434  Weight: 119.3 kg (263 lb)    Examination:  General exam: Appears calm and comfortable  Respiratory system: Clear to auscultation. Respiratory effort normal. Cardiovascular system: S1 & S2 heard, RRR. No JVD, murmurs, rubs, gallops or clicks. No pedal edema. Gastrointestinal system: Abdomen is nondistended, tender in LLQ Central nervous system: Alert and oriented. No focal neurological deficits. Extremities: Symmetric 5 x 5 power. Skin: No rashes, lesions or ulcers Psychiatry: Judgement and insight appear normal. Mood & affect appropriate.    Data Reviewed: I have personally reviewed following labs and imaging studies  CBC:  Recent Labs Lab 02/19/16 1540 02/20/16 0320  WBC 20.6* 13.4*  NEUTROABS 16.6*  --   HGB 10.8* 9.5*  HCT 33.6* 29.8*  MCV 83.0 82.8  PLT 530* 123456*   Basic Metabolic Panel:  Recent Labs Lab 02/19/16 1540 02/20/16 0320  NA 135 137  K 3.7 3.4*  CL 100* 101  CO2 25 27  GLUCOSE 101* 100*  BUN 18 10  CREATININE  0.78 0.65  CALCIUM 9.0 8.8*   Coagulation Profile:  Recent Labs Lab 02/20/16 0320  INR 1.23   Urine analysis:    Component Value Date/Time   COLORURINE AMBER (A) 02/19/2016 1636   APPEARANCEUR TURBID (A) 02/19/2016 1636   LABSPEC 1.025 02/19/2016 1636   PHURINE 5.0 02/19/2016 1636   GLUCOSEU NEGATIVE 02/19/2016 1636   HGBUR NEGATIVE 02/19/2016 1636   BILIRUBINUR NEGATIVE 02/19/2016 1636   KETONESUR NEGATIVE 02/19/2016 1636   PROTEINUR NEGATIVE 02/19/2016 1636   NITRITE NEGATIVE 02/19/2016 1636   LEUKOCYTESUR NEGATIVE 02/19/2016 1636   Radiology Studies: Ct Abdomen Pelvis W Contrast  Result Date: 02/19/2016 CLINICAL DATA:  Left abdominal pain for several months, worsening last 2-3 days, elevated white blood cell count, history of perforated colon and diverticulitis. EXAM: CT ABDOMEN AND PELVIS WITH CONTRAST TECHNIQUE: Multidetector CT imaging of the abdomen and  pelvis was performed using the standard protocol following bolus administration of intravenous contrast. CONTRAST:  137mL ISOVUE-300 IOPAMIDOL (ISOVUE-300) INJECTION 61% COMPARISON:  None. FINDINGS: Lower chest: No acute abnormality. Hepatobiliary: No focal liver abnormality is seen. Probable punctate stone within the gallbladder. No gallbladder wall thickening or pericholecystic fluid. No bile duct dilatation seen. Pancreas: Unremarkable. No pancreatic ductal dilatation or surrounding inflammatory changes. Spleen: Normal in size without focal abnormality. Adrenals/Urinary Tract: Adrenal glands appear normal. Benign appearing cyst within the lateral cortex of the left kidney measures 2.6 x 2.5 cm. Kidneys otherwise unremarkable bilaterally without stone or hydronephrosis. No ureteral or bladder calculi identified. Bladder is unremarkable, decompressed. 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 No dilated large or small bowel loops. Appendix is normal. Vascular/Lymphatic: Scattered mild atherosclerotic changes of the normal caliber abdominal aorta. No enlarged lymph nodes appreciated in the abdomen or pelvis. Reproductive: Uterus and bilateral adnexa are unremarkable. Other: There is a complex intra-abdominal fluid collection within the left paracolic gutter, measuring approximately 12 cm craniocaudal dimension and 2.5 cm greatest thickness, with surrounding inflammatory fluid stranding. No free intraperitoneal air appreciated. Musculoskeletal: Ill-defined edema within the subcutaneous soft tissues of the left lateral abdominal wall, possibly reactive to and/or contiguous with the fluid collection in the underlying paracolic gutter. No acute or suspicious osseous finding. IMPRESSION: 1. Complex intra-abdominal fluid collection within the left paracolic gutter, with a walled-off appearance, measuring approximately 12 cm craniocaudal dimension and 2.5  cm greatest thickness, with surrounding inflammatory fluid stranding, presumed abscess collection. No obvious source for the presumed abscess. No air is seen within the collection to confirm abscess or to suggest fistulous connection with adjacent bowel. 2. Ill-defined edema/fluid stranding within the subcutaneous soft tissues of the left lateral abdominal wall, and thickening/edema of the intervening abdominal wall musculature, almost certainly reactive to and/or contiguous with the presumed abscess collection in the underlying paracolic gutter. No circumscribed fluid collection is seen within these superficial soft tissues. 3. Surgical changes of previous partial colectomy with left anterior abdominal wall colostomy creation. No evidence of obstruction or other surgical complicating feature at the colostomy site. 4. Aortic atherosclerosis. Probable punctate gallstone within the otherwise normal-appearing gallbladder. Additional chronic/incidental findings detailed above. These results were called by telephone at the time of interpretation on 02/19/2016 at 5:59 pm to Dr. Davonna Belling , who verbally acknowledged these results. Electronically Signed   By: Franki Cabot M.D.   On: 02/19/2016 18:06   Ct Image Guided Drainage By Percutaneous Catheter  Result Date: 02/20/2016 INDICATION: Diverticular abscess, left lower quadrant EXAM: CT GUIDED DRAINAGE OF DIVERTICULAR  ABSCESS MEDICATIONS: The patient is currently admitted to the hospital and receiving intravenous antibiotics. The antibiotics were administered within an appropriate time frame prior to the initiation of the procedure. ANESTHESIA/SEDATION: 2.0 mg IV Versed 100 mcg IV Fentanyl Moderate Sedation Time:  15 MINUTES The patient was continuously monitored during the procedure by the interventional radiology nurse under my direct supervision. COMPLICATIONS: None immediate. TECHNIQUE: Informed written consent was obtained from the patient after a thorough  discussion of the procedural risks, benefits and alternatives. All questions were addressed. Maximal Sterile Barrier Technique was utilized including caps, mask, sterile gowns, sterile gloves, sterile drape, hand hygiene and skin antiseptic. A timeout was performed prior to the initiation of the procedure. PROCEDURE: previous imaging reviewed. patient positioned supine. noncontrast localization ct performed. the left lower quadrant fluid collection along the pericolic gutter was localized. overlying skin marked. The LEFT LOWER QUADRANT was prepped with Chlorhexidine in a sterile fashion, and a sterile drape was applied covering the operative field. A sterile gown and sterile gloves were used for the procedure. Local anesthesia was provided with 1% Lidocaine. Under sterile conditions and local anesthesia, an 18 gauge 10 cm access needle was advanced percutaneously from an anterior oblique approach into the left lower quadrant diverticular abscess. Needle position confirmed with CT. Syringe aspiration yielded purulent fluid. Sample sent for Gram stain culture. Guidewire inserted followed by tract dilatation to insert a 10 [place] drain. Drain catheter position confirmed with CT. Syringe aspiration yielded 30 cc purulent fluid. Catheter secured with a Prolene suture and a sterile dressing. External suction bulb connected. No immediate complication. Patient tolerated the procedure well. FINDINGS: CT imaging confirms percutaneous needle access for drain insertion into the left lower quadrant diverticular abscess IMPRESSION: Successful left lower quadrant CT diverticular abscess drain Electronically Signed   By: Jerilynn Mages.  Shick M.D.   On: 02/20/2016 15:14   Scheduled Meds: . atorvastatin  10 mg Oral QHS  . fentaNYL      . lidocaine      . losartan  50 mg Oral Daily  . midazolam      . mometasone-formoterol  2 puff Inhalation BID  . nebivolol  5 mg Oral Daily  . pantoprazole  40 mg Oral Daily  . piperacillin-tazobactam  (ZOSYN)  IV  3.375 g Intravenous Q8H  . pramipexole  0.5 mg Oral QHS  . venlafaxine XR  150 mg Oral Q breakfast   Continuous Infusions: . lactated ringers 100 mL/hr at 02/19/16 2255     LOS: 1 day   Time spent: 20 minutes   Faye Ramsay, MD [facility] Pager [phone]  If 7PM-7AM, please contact night-coverage [email or web address] Password [facility] 02/20/2016, 3:49 PM

## 2016-02-20 NOTE — Procedures (Signed)
S/p CT DRAIN LLQ ABDOMINAL ABSCESS  No comp 30cc pus aspirated CX SENT Full report in PACS

## 2016-02-20 NOTE — Progress Notes (Signed)
Patient is received from [facility] with acute abdominal pain, alert, oriented x4, no visual S/S of acute distress noted, medicated for pain with Morphine 2 mg IVP with mild relief, NPO at present time, patient is aware and compliant, LUQ with colostomy, bowel sound active all quadrants, denies N/V, voiding in toilet. Will continue to monitor.

## 2016-02-20 NOTE — Sedation Documentation (Signed)
Patient is resting comfortably. 

## 2016-02-20 NOTE — Sedation Documentation (Signed)
Patient denies pain and is resting comfortably.  

## 2016-02-20 NOTE — H&P (Signed)
Chief Complaint: Left paracolic fluid collection  Referring Physician(s): Karmen Bongo  Supervising Physician: Daryll Brod  Patient Status: [facility] - In-pt  History of Present Illness: [name] [name] is a 56 y.o. female with medical history significant of perforated diverticulitis in 3/16 resulting in colostomy.  She also has OSA on CPAP, and HTN.  She presented tot he ED with abdominal pain for the last few months.    She started running a fever over the weekend.    Pain is in LLQ and radiates around to the back.   States her fever was as high as 101.7.  CT scan reveals left paracolic fluid collection.  We are asked to perform image guided percutaneous drain placement.  She is NPO. Labs reviewed. No blood thinners.  Past Medical History:  Diagnosis Date  . Asthma   . Depression   . Diarrhea age 36  . Diverticulitis SEP 2015   PERFORATED, SIGMOID COLON  . GERD (gastroesophageal reflux disease)   . HTN (hypertension)   . Hyperlipidemia   . Restless legs syndrome   . Sleep apnea [id number]   CPAP, uncertain setting    Past Surgical History:  Procedure Laterality Date  . bladder surgery     bladder tack  . CERVICAL CONE BIOPSY    . COLONOSCOPY N/A 04/05/2014   [id number] HH/moderate diverticulosis  . 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 ESOPHAGOGASTRODUODENOSCOPY N/A 04/05/2014   [id number] gastric polyps/mild erosive gastritis  . RECTOCELE REPAIR    . tubal ligation      Allergies: Penicillins; Cortisone; Crestor [rosuvastatin calcium]; Dilaudid [hydromorphone hcl]; Neosporin [neomycin-bacitracin zn-polymyx]; and Vancomycin  Medications: Prior to Admission medications   Medication Sig Start Date End Date Taking? Authorizing Provider  albuterol (PROVENTIL HFA;VENTOLIN HFA) 108 (90 BASE) MCG/ACT inhaler Inhale into the lungs every 6 (six) hours as needed for wheezing or shortness of breath.   Yes Historical Provider, MD  atorvastatin (LIPITOR) 10 MG  tablet Take 10 mg by mouth at bedtime.    Yes Historical Provider, MD  Calcium Carbonate-Vitamin D 600-400 MG-UNIT tablet Take 1 tablet by mouth daily.    Yes Historical Provider, MD  Fluticasone-Salmeterol (ADVAIR) 250-50 MCG/DOSE AEPB Inhale 1 puff into the lungs 2 (two) times daily.   Yes Historical Provider, MD  losartan (COZAAR) 50 MG tablet Take 50 mg by mouth daily.   Yes Historical Provider, MD  nebivolol (BYSTOLIC) 5 MG tablet Take 5 mg by mouth daily.   Yes Historical Provider, MD  oxyCODONE-acetaminophen (PERCOCET/ROXICET) 5-325 MG tablet Take 1 tablet by mouth every 6 (six) hours as needed for moderate pain.  02/09/16  Yes Historical Provider, MD  pantoprazole (PROTONIX) 40 MG tablet Take 40 mg by mouth daily.   Yes Historical Provider, MD  polyethylene glycol powder (GLYCOLAX/MIRALAX) powder MIX 1 HEAPING TABLESPOONFUL IN 8 OUNCES OF A LIQUID AND DRINK BID PRF CONSTIPATION 02/09/16  Yes Historical Provider, MD  pramipexole (MIRAPEX) 0.5 MG tablet Take 0.25 mg by mouth at bedtime.    Yes Historical Provider, MD  pramipexole (MIRAPEX) 0.5 MG tablet Take 0.5 mg by mouth at bedtime.    Yes Historical Provider, MD  venlafaxine XR (EFFEXOR-XR) 150 MG 24 hr capsule Take 150 mg by mouth daily with breakfast.   Yes Historical Provider, MD     Family History  Problem Relation Age of Onset  . Diverticulitis Mother   . Diverticulitis Maternal Aunt   . Diverticulitis Maternal Grandmother     Social History  Social History  . Marital status: Married    Spouse name: N/A  . Number of children: N/A  . Years of education: N/A   Occupational History  . previously medical office assistant    Social History Main Topics  . Smoking status: Former Smoker    Quit date: 05/21/1997  . Smokeless tobacco: Never Used  . Alcohol use No  . Drug use: No  . Sexual activity: Not Asked   Other Topics Concern  . None   Social History Narrative   Mother passed 5 yrs ago.    Review of Systems: A 12  point ROS discussed  Review of Systems  Constitutional: Positive for activity change and fever. Negative for appetite change and chills.  HENT: Negative.   Respiratory: Negative.   Cardiovascular: Negative.   Gastrointestinal: Positive for abdominal pain. Negative for nausea and vomiting.  Genitourinary: Negative.   Musculoskeletal: Negative.   Skin: Negative.   Neurological: Negative.   Psychiatric/Behavioral: Negative.     Vital Signs: BP 116/71 (BP Location: Left Arm)   Pulse (!) 101   Temp 100.3 F (37.9 C) (Oral) Comment: RN notified  Resp 18   Ht 5' (1.524 m)   Wt 263 lb (119.3 kg)   LMP 08/04/2013   SpO2 97%   BMI 51.36 kg/m   Physical Exam  Constitutional: She is oriented to person, place, and time.  Obese, NAD  HENT:  Head: Normocephalic and atraumatic.  Eyes: EOM are normal.  Neck: Normal range of motion.  Cardiovascular: Normal rate, regular rhythm and normal heart sounds.   No murmur heard. Pulmonary/Chest: Effort normal and breath sounds normal. No respiratory distress. She has no wheezes.  Abdominal: Soft. She exhibits no distension. There is tenderness.  Colostomy in place. Tenderness LLQ  Musculoskeletal: Normal range of motion.  Neurological: She is alert and oriented to person, place, and time.  Skin: Skin is warm and dry.  Psychiatric: She has a normal mood and affect. Her behavior is normal. Judgment and thought content normal.  Vitals reviewed.   Mallampati Score:  MD Evaluation Airway: WNL Heart: WNL Abdomen: WNL Chest/ Lungs: WNL Other Pertinent Findings: Pt denies any issues with previous anesthesia or intubation ASA  Classification: 2 Mallampati/Airway Score: One  Imaging: Ct Abdomen Pelvis W Contrast  Result Date: 02/19/2016 CLINICAL DATA:  Left abdominal pain for several months, worsening last 2-3 days, elevated white blood cell count, history of perforated colon and diverticulitis. EXAM: CT ABDOMEN AND PELVIS WITH CONTRAST  TECHNIQUE: Multidetector CT imaging of the abdomen and pelvis was performed using the standard protocol following bolus administration of intravenous contrast. CONTRAST:  134mL ISOVUE-300 IOPAMIDOL (ISOVUE-300) INJECTION 61% COMPARISON:  None. FINDINGS: Lower chest: No acute abnormality. Hepatobiliary: No focal liver abnormality is seen. Probable punctate stone within the gallbladder. No gallbladder wall thickening or pericholecystic fluid. No bile duct dilatation seen. Pancreas: Unremarkable. No pancreatic ductal dilatation or surrounding inflammatory changes. Spleen: Normal in size without focal abnormality. Adrenals/Urinary Tract: Adrenal glands appear normal. Benign appearing cyst within the lateral cortex of the left kidney measures 2.6 x 2.5 cm. Kidneys otherwise unremarkable bilaterally without stone or hydronephrosis. No ureteral or bladder calculi identified. Bladder is unremarkable, decompressed. 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 No dilated large or small bowel loops. Appendix is normal. Vascular/Lymphatic: Scattered mild atherosclerotic changes of the normal caliber abdominal aorta. No enlarged lymph nodes appreciated in the abdomen or pelvis. Reproductive: Uterus and bilateral adnexa are  unremarkable. Other: There is a complex intra-abdominal fluid collection within the left paracolic gutter, measuring approximately 12 cm craniocaudal dimension and 2.5 cm greatest thickness, with surrounding inflammatory fluid stranding. No free intraperitoneal air appreciated. Musculoskeletal: Ill-defined edema within the subcutaneous soft tissues of the left lateral abdominal wall, possibly reactive to and/or contiguous with the fluid collection in the underlying paracolic gutter. No acute or suspicious osseous finding. IMPRESSION: 1. Complex intra-abdominal fluid collection within the left paracolic gutter, with a walled-off appearance,  measuring approximately 12 cm craniocaudal dimension and 2.5 cm greatest thickness, with surrounding inflammatory fluid stranding, presumed abscess collection. No obvious source for the presumed abscess. No air is seen within the collection to confirm abscess or to suggest fistulous connection with adjacent bowel. 2. Ill-defined edema/fluid stranding within the subcutaneous soft tissues of the left lateral abdominal wall, and thickening/edema of the intervening abdominal wall musculature, almost certainly reactive to and/or contiguous with the presumed abscess collection in the underlying paracolic gutter. No circumscribed fluid collection is seen within these superficial soft tissues. 3. Surgical changes of previous partial colectomy with left anterior abdominal wall colostomy creation. No evidence of obstruction or other surgical complicating feature at the colostomy site. 4. Aortic atherosclerosis. Probable punctate gallstone within the otherwise normal-appearing gallbladder. Additional chronic/incidental findings detailed above. These results were called by telephone at the time of interpretation on 02/19/2016 at 5:59 pm to Dr. Davonna Belling , who verbally acknowledged these results. Electronically Signed   By: Franki Cabot M.D.   On: 02/19/2016 18:06    Labs:  CBC:  Recent Labs  02/19/16 1540 02/20/16 0320  WBC 20.6* 13.4*  HGB 10.8* 9.5*  HCT 33.6* 29.8*  PLT 530* 432*    COAGS:  Recent Labs  02/20/16 0320  INR 1.23    BMP:  Recent Labs  02/19/16 1540 02/20/16 0320  NA 135 137  K 3.7 3.4*  CL 100* 101  CO2 25 27  GLUCOSE 101* 100*  BUN 18 10  CALCIUM 9.0 8.8*  CREATININE 0.78 0.65  GFRNONAA >60 >60  GFRAA >60 >60    LIVER FUNCTION TESTS: No results for input(s): BILITOT, AST, ALT, ALKPHOS, PROT, ALBUMIN in the last 8760 hours.  TUMOR MARKERS: No results for input(s): AFPTM, CEA, CA199, CHROMGRNA in the last 8760 hours.  Assessment and Plan:  Left lower  quadrant fluid collection  Will proceed with drain placement today by Dr. Annamaria Boots.  Patient will need general surgery consult as well.  Risks and Benefits discussed with the patient including bleeding, infection, damage to adjacent structures, bowel perforation/fistula connection, and sepsis.  All of the patient's questions were answered, patient is agreeable to proceed. Consent signed and in chart.  Thank you for this interesting consult.  I greatly enjoyed meeting Janeane Jurica and look forward to participating in their care.  A copy of this report was sent to the requesting provider on this date.  Electronically Signed: Murrell Redden PA-C 02/20/2016, 9:41 AM   I spent a total of 40 Minutes in face to face in clinical consultation, greater than 50% of which was counseling/coordinating care for perc drain placement

## 2016-02-21 LAB — BASIC METABOLIC PANEL
ANION GAP: 9 (ref 5–15)
BUN: 15 mg/dL (ref 6–20)
CALCIUM: 8.7 mg/dL — AB (ref 8.9–10.3)
CO2: 27 mmol/L (ref 22–32)
CREATININE: 0.64 mg/dL (ref 0.44–1.00)
Chloride: 101 mmol/L (ref 101–111)
GFR calc Af Amer: >60 mL/min (ref >60)
Glucose, Bld: 110 mg/dL — ABNORMAL HIGH (ref 65–99)
Potassium: 3.8 mmol/L (ref 3.5–5.1)
SODIUM: 137 mmol/L (ref 135–145)

## 2016-02-21 MED ORDER — POLYETHYLENE GLYCOL 3350 17 G PO PACK: 17.0000 g | PACK | Freq: Every day | ORAL | Status: DC | PRN

## 2016-02-21 NOTE — Progress Notes (Signed)
Pharmacy Antibiotic Note  [name] [name] is a 56 y.o. female admitted on 02/19/2016 with intra-abdominal infection.  Pharmacy has been consulted for Zosyn dosing.  S/p drainage and perc drain placement in IR 1/16.  Plan: Zosyn 3.375g IV q8h (4 hour infusion).  Follow c/s, renal function, length of therapy  Height: 5' (152.4 cm) Weight: 263 lb (119.3 kg) IBW/kg (Calculated) : 45.5  Temp (24hrs), Avg:98.6 F (37 C), Min:97.7 F (36.5 C), Max:99 F (37.2 C)   Recent Labs Lab 02/19/16 1540 02/20/16 0320  WBC 20.6* 13.4*  CREATININE 0.78 0.65    Estimated Creatinine Clearance: 95.2 mL/min (by C-G formula based on SCr of 0.65 mg/dL).    Allergies  Allergen Reactions  . Penicillins Shortness Of Breath    Tolerates Zosyn  . Cortisone Hives  . Crestor [Rosuvastatin Calcium] Other (See Comments)    "started messing up my liver"  . Dilaudid [Hydromorphone Hcl] Other (See Comments)    Respiratory depression  . Neosporin [Neomycin-Bacitracin Zn-Polymyx] Hives  . Vancomycin Nausea And Vomiting    Antimicrobials this admission: Zosyn 1/15 >>   Dose adjustments this admission: n/a  Microbiology results: 1/16 abdominal abscess: abundant GPC  Thank you for allowing pharmacy to be a part of this patient's care.   Lovel Suazo D. Yun Gutierrez, PharmD, BCPS Clinical Pharmacist Pager: [phone] 02/21/2016 12:58 PM

## 2016-02-21 NOTE — Progress Notes (Signed)
Patient ID: [name] [name], female   DOB: [birth year], 56 y.o.   MRN: [id number]    PROGRESS NOTE    [name] [name]  [id number] DOB: 02-Jan-1961 DOA: 02/19/2016  PCP: Moshe Cipro, MD   Brief Narrative:  56 y.o. 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  Started running a fever over the weekend.  Went to PCP and they sent her to ED for evaluation.   Assessment & Plan:  Sepsis secondary to Abdominal abscess - Patient with a remote h/o perforated diverticulitis which eventually led to colostomy - Found to have a large 12 cm left paracolic gutter abscess - Uncertain etiology - possibly from colostomy? Or has h/o C diff and possibly infection walled off? - IR consulted for drainage - pt is now s/p CT drain in LLQ for abd abscess, appreciate IR team assistance  - continue zosyn day #3 - WBC is trending down form 20 K --> 13 K, repeat CBC in AM - OK to advance diet   Anemia - Hgb 10.8 (11.3 on 02/09/16, 12.1 on 01/24/16) - currently no signs of active bleeding even though Hg down since admission  - CBC in AM  Hypokalemia - supplement, BMP in AM  OSA - on CPAP  HTN, essential  - Continue Cozaar and Bystolic  Morbid obesity  - Body mass index is 51.36 kg/m  DVT prophylaxis: SCD's Code Status: Full  Family Communication: Patient at bedside  Disposition Plan: home in 1-2 days   Consultants:   IR  Procedures:   CT guided drain placement in LLQ 1/16  Antimicrobials:   Zosyn 1/15 -->  Subjective: Pain much improved, no N/V, less abd pain.  Objective: Vitals:   02/20/16 1343 02/20/16 2043 02/21/16 0100 02/21/16 0440  BP: 101/63 96/62 103/68 (!) 93/57  Pulse: 93 84 82 73  Resp: 18 18 18 16   Temp:  99 F (37.2 C) 99 F (37.2 C) 97.7 F (36.5 C)  TempSrc:  Oral Oral Oral  SpO2: 95% 96% 96% 100%  Weight:      Height:         Intake/Output Summary (Last 24 hours) at 02/21/16 1029 Last data filed at 02/21/16 [id number]  Gross per 24 hour  Intake           715.83 ml  Output              110 ml  Net           605.83 ml   Filed Weights   02/19/16 1434  Weight: 119.3 kg (263 lb)    Examination:  General exam: Appears calm and comfortable  Respiratory system: Clear to auscultation. Respiratory effort normal. Cardiovascular system: S1 & S2 heard, RRR. No JVD, murmurs, rubs, gallops or clicks. No pedal edema. Gastrointestinal system: Abdomen is nondistended, mildly tender in LLQ Central nervous system: Alert and oriented. No focal neurological deficits. Extremities: Symmetric 5 x 5 power. Skin: No rashes, lesions or ulcers Psychiatry: Judgement and insight appear normal. Mood & affect appropriate.   Data Reviewed: I have personally reviewed following labs and imaging studies  CBC:  Recent Labs Lab 02/19/16 1540 02/20/16 0320  WBC 20.6* 13.4*  NEUTROABS 16.6*  --   HGB 10.8* 9.5*  HCT 33.6* 29.8*  MCV 83.0 82.8  PLT 530* 123456*   Basic Metabolic Panel:  Recent Labs Lab 02/19/16 1540 02/20/16 0320  NA  135 137  K 3.7 3.4*  CL 100* 101  CO2 25 27  GLUCOSE 101* 100*  BUN 18 10  CREATININE 0.78 0.65  CALCIUM 9.0 8.8*   Coagulation Profile:  Recent Labs Lab 02/20/16 0320  INR 1.23   Urine analysis:    Component Value Date/Time   COLORURINE AMBER (A) 02/19/2016 1636   APPEARANCEUR TURBID (A) 02/19/2016 1636   LABSPEC 1.025 02/19/2016 1636   PHURINE 5.0 02/19/2016 1636   GLUCOSEU NEGATIVE 02/19/2016 1636   HGBUR NEGATIVE 02/19/2016 1636   BILIRUBINUR NEGATIVE 02/19/2016 1636   KETONESUR NEGATIVE 02/19/2016 1636   PROTEINUR NEGATIVE 02/19/2016 1636   NITRITE NEGATIVE 02/19/2016 1636   LEUKOCYTESUR NEGATIVE 02/19/2016 1636   Radiology Studies: Ct Abdomen Pelvis W Contrast  Result Date: 02/19/2016 CLINICAL DATA:  Left abdominal pain for several months, worsening last 2-3 days,  elevated white blood cell count, history of perforated colon and diverticulitis. EXAM: CT ABDOMEN AND PELVIS WITH CONTRAST TECHNIQUE: Multidetector CT imaging of the abdomen and pelvis was performed using the standard protocol following bolus administration of intravenous contrast. CONTRAST:  159mL ISOVUE-300 IOPAMIDOL (ISOVUE-300) INJECTION 61% COMPARISON:  None. FINDINGS: Lower chest: No acute abnormality. Hepatobiliary: No focal liver abnormality is seen. Probable punctate stone within the gallbladder. No gallbladder wall thickening or pericholecystic fluid. No bile duct dilatation seen. Pancreas: Unremarkable. No pancreatic ductal dilatation or surrounding inflammatory changes. Spleen: Normal in size without focal abnormality. Adrenals/Urinary Tract: Adrenal glands appear normal. Benign appearing cyst within the lateral cortex of the left kidney measures 2.6 x 2.5 cm. Kidneys otherwise unremarkable bilaterally without stone or hydronephrosis. No ureteral or bladder calculi identified. Bladder is unremarkable, decompressed. 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 No dilated large or small bowel loops. Appendix is normal. Vascular/Lymphatic: Scattered mild atherosclerotic changes of the normal caliber abdominal aorta. No enlarged lymph nodes appreciated in the abdomen or pelvis. Reproductive: Uterus and bilateral adnexa are unremarkable. Other: There is a complex intra-abdominal fluid collection within the left paracolic gutter, measuring approximately 12 cm craniocaudal dimension and 2.5 cm greatest thickness, with surrounding inflammatory fluid stranding. No free intraperitoneal air appreciated. Musculoskeletal: Ill-defined edema within the subcutaneous soft tissues of the left lateral abdominal wall, possibly reactive to and/or contiguous with the fluid collection in the underlying paracolic gutter. No acute or suspicious osseous finding.  IMPRESSION: 1. Complex intra-abdominal fluid collection within the left paracolic gutter, with a walled-off appearance, measuring approximately 12 cm craniocaudal dimension and 2.5 cm greatest thickness, with surrounding inflammatory fluid stranding, presumed abscess collection. No obvious source for the presumed abscess. No air is seen within the collection to confirm abscess or to suggest fistulous connection with adjacent bowel. 2. Ill-defined edema/fluid stranding within the subcutaneous soft tissues of the left lateral abdominal wall, and thickening/edema of the intervening abdominal wall musculature, almost certainly reactive to and/or contiguous with the presumed abscess collection in the underlying paracolic gutter. No circumscribed fluid collection is seen within these superficial soft tissues. 3. Surgical changes of previous partial colectomy with left anterior abdominal wall colostomy creation. No evidence of obstruction or other surgical complicating feature at the colostomy site. 4. Aortic atherosclerosis. Probable punctate gallstone within the otherwise normal-appearing gallbladder. Additional chronic/incidental findings detailed above. These results were called by telephone at the time of interpretation on 02/19/2016 at 5:59 pm to Dr. Davonna Belling , who verbally acknowledged these results. Electronically Signed   By: Franki Cabot M.D.   On: 02/19/2016 18:06  Ct Image Guided Drainage By Percutaneous Catheter  Result Date: 02/20/2016 INDICATION: Diverticular abscess, left lower quadrant EXAM: CT GUIDED DRAINAGE OF DIVERTICULAR ABSCESS MEDICATIONS: The patient is currently admitted to the hospital and receiving intravenous antibiotics. The antibiotics were administered within an appropriate time frame prior to the initiation of the procedure. ANESTHESIA/SEDATION: 2.0 mg IV Versed 100 mcg IV Fentanyl Moderate Sedation Time:  15 MINUTES The patient was continuously monitored during the procedure  by the interventional radiology nurse under my direct supervision. COMPLICATIONS: None immediate. TECHNIQUE: Informed written consent was obtained from the patient after a thorough discussion of the procedural risks, benefits and alternatives. All questions were addressed. Maximal Sterile Barrier Technique was utilized including caps, mask, sterile gowns, sterile gloves, sterile drape, hand hygiene and skin antiseptic. A timeout was performed prior to the initiation of the procedure. PROCEDURE: previous imaging reviewed. patient positioned supine. noncontrast localization ct performed. the left lower quadrant fluid collection along the pericolic gutter was localized. overlying skin marked. The LEFT LOWER QUADRANT was prepped with Chlorhexidine in a sterile fashion, and a sterile drape was applied covering the operative field. A sterile gown and sterile gloves were used for the procedure. Local anesthesia was provided with 1% Lidocaine. Under sterile conditions and local anesthesia, an 18 gauge 10 cm access needle was advanced percutaneously from an anterior oblique approach into the left lower quadrant diverticular abscess. Needle position confirmed with CT. Syringe aspiration yielded purulent fluid. Sample sent for Gram stain culture. Guidewire inserted followed by tract dilatation to insert a 10 [place] drain. Drain catheter position confirmed with CT. Syringe aspiration yielded 30 cc purulent fluid. Catheter secured with a Prolene suture and a sterile dressing. External suction bulb connected. No immediate complication. Patient tolerated the procedure well. FINDINGS: CT imaging confirms percutaneous needle access for drain insertion into the left lower quadrant diverticular abscess IMPRESSION: Successful left lower quadrant CT diverticular abscess drain Electronically Signed   By: Jerilynn Mages.  Shick M.D.   On: 02/20/2016 15:14   Scheduled Meds: . atorvastatin  10 mg Oral QHS  . losartan  50 mg Oral Daily  .  mometasone-formoterol  2 puff Inhalation BID  . nebivolol  5 mg Oral Daily  . pantoprazole  40 mg Oral Daily  . piperacillin-tazobactam (ZOSYN)  IV  3.375 g Intravenous Q8H  . pramipexole  0.5 mg Oral QHS  . venlafaxine XR  150 mg Oral Q breakfast   Continuous Infusions: . lactated ringers 50 mL/hr at 02/20/16 1555     LOS: 2 days   Time spent: 20 minutes   Faye Ramsay, MD [facility] Pager [phone]  If 7PM-7AM, please contact night-coverage [email or web address] Password [facility] 02/21/2016, 10:29 AM

## 2016-02-21 NOTE — Progress Notes (Signed)
Referring Physician(s): Thomasenia Bottoms  Supervising Physician: Aletta Edouard  Patient Status:  [facility] - In-pt  Chief Complaint:  Pelvic abscess  Subjective:  Pt feeling a little better today; denies N/V or increasing abd pain  Allergies: Penicillins; Cortisone; Crestor [rosuvastatin calcium]; Dilaudid [hydromorphone hcl]; Neosporin [neomycin-bacitracin zn-polymyx]; and Vancomycin  Medications: Prior to Admission medications   Medication Sig Start Date End Date Taking? Authorizing Provider  albuterol (PROVENTIL HFA;VENTOLIN HFA) 108 (90 BASE) MCG/ACT inhaler Inhale into the lungs every 6 (six) hours as needed for wheezing or shortness of breath.   Yes Historical Provider, MD  atorvastatin (LIPITOR) 10 MG tablet Take 10 mg by mouth at bedtime.    Yes Historical Provider, MD  Calcium Carbonate-Vitamin D 600-400 MG-UNIT tablet Take 1 tablet by mouth daily.    Yes Historical Provider, MD  Fluticasone-Salmeterol (ADVAIR) 250-50 MCG/DOSE AEPB Inhale 1 puff into the lungs 2 (two) times daily.   Yes Historical Provider, MD  losartan (COZAAR) 50 MG tablet Take 50 mg by mouth daily.   Yes Historical Provider, MD  nebivolol (BYSTOLIC) 5 MG tablet Take 5 mg by mouth daily.   Yes Historical Provider, MD  oxyCODONE-acetaminophen (PERCOCET/ROXICET) 5-325 MG tablet Take 1 tablet by mouth every 6 (six) hours as needed for moderate pain.  02/09/16  Yes Historical Provider, MD  pantoprazole (PROTONIX) 40 MG tablet Take 40 mg by mouth daily.   Yes Historical Provider, MD  polyethylene glycol powder (GLYCOLAX/MIRALAX) powder MIX 1 HEAPING TABLESPOONFUL IN 8 OUNCES OF A LIQUID AND DRINK BID PRF CONSTIPATION 02/09/16  Yes Historical Provider, MD  pramipexole (MIRAPEX) 0.5 MG tablet Take 0.25 mg by mouth at bedtime.    Yes Historical Provider, MD  pramipexole (MIRAPEX) 0.5 MG tablet Take 0.5 mg by mouth at bedtime.    Yes Historical Provider, MD  venlafaxine XR (EFFEXOR-XR) 150 MG 24 hr capsule Take 150 mg by mouth  daily with breakfast.   Yes Historical Provider, MD     Vital Signs: BP (!) 93/57 (BP Location: Left Arm)   Pulse 73   Temp 97.7 F (36.5 C) (Oral)   Resp 16   Ht 5' (1.524 m)   Wt 263 lb (119.3 kg)   LMP 08/04/2013   SpO2 100%   BMI 51.36 kg/m   Physical Exam LLQ drain intact, dressing dry, site mildly tender, output 110 cc, cx's pend  Imaging: Ct Abdomen Pelvis W Contrast  Result Date: 02/19/2016 CLINICAL DATA:  Left abdominal pain for several months, worsening last 2-3 days, elevated white blood cell count, history of perforated colon and diverticulitis. EXAM: CT ABDOMEN AND PELVIS WITH CONTRAST TECHNIQUE: Multidetector CT imaging of the abdomen and pelvis was performed using the standard protocol following bolus administration of intravenous contrast. CONTRAST:  158mL ISOVUE-300 IOPAMIDOL (ISOVUE-300) INJECTION 61% COMPARISON:  None. FINDINGS: Lower chest: No acute abnormality. Hepatobiliary: No focal liver abnormality is seen. Probable punctate stone within the gallbladder. No gallbladder wall thickening or pericholecystic fluid. No bile duct dilatation seen. Pancreas: Unremarkable. No pancreatic ductal dilatation or surrounding inflammatory changes. Spleen: Normal in size without focal abnormality. Adrenals/Urinary Tract: Adrenal glands appear normal. Benign appearing cyst within the lateral cortex of the left kidney measures 2.6 x 2.5 cm. Kidneys otherwise unremarkable bilaterally without stone or hydronephrosis. No ureteral or bladder calculi identified. Bladder is unremarkable, decompressed. 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 No dilated large or small bowel loops. Appendix is normal. Vascular/Lymphatic: Scattered mild atherosclerotic changes of  the normal caliber abdominal aorta. No enlarged lymph nodes appreciated in the abdomen or pelvis. Reproductive: Uterus and bilateral adnexa are unremarkable. Other: There  is a complex intra-abdominal fluid collection within the left paracolic gutter, measuring approximately 12 cm craniocaudal dimension and 2.5 cm greatest thickness, with surrounding inflammatory fluid stranding. No free intraperitoneal air appreciated. Musculoskeletal: Ill-defined edema within the subcutaneous soft tissues of the left lateral abdominal wall, possibly reactive to and/or contiguous with the fluid collection in the underlying paracolic gutter. No acute or suspicious osseous finding. IMPRESSION: 1. Complex intra-abdominal fluid collection within the left paracolic gutter, with a walled-off appearance, measuring approximately 12 cm craniocaudal dimension and 2.5 cm greatest thickness, with surrounding inflammatory fluid stranding, presumed abscess collection. No obvious source for the presumed abscess. No air is seen within the collection to confirm abscess or to suggest fistulous connection with adjacent bowel. 2. Ill-defined edema/fluid stranding within the subcutaneous soft tissues of the left lateral abdominal wall, and thickening/edema of the intervening abdominal wall musculature, almost certainly reactive to and/or contiguous with the presumed abscess collection in the underlying paracolic gutter. No circumscribed fluid collection is seen within these superficial soft tissues. 3. Surgical changes of previous partial colectomy with left anterior abdominal wall colostomy creation. No evidence of obstruction or other surgical complicating feature at the colostomy site. 4. Aortic atherosclerosis. Probable punctate gallstone within the otherwise normal-appearing gallbladder. Additional chronic/incidental findings detailed above. These results were called by telephone at the time of interpretation on 02/19/2016 at 5:59 pm to Dr. Davonna Belling , who verbally acknowledged these results. Electronically Signed   By: Franki Cabot M.D.   On: 02/19/2016 18:06   Ct Image Guided Drainage By Percutaneous  Catheter  Result Date: 02/20/2016 INDICATION: Diverticular abscess, left lower quadrant EXAM: CT GUIDED DRAINAGE OF DIVERTICULAR ABSCESS MEDICATIONS: The patient is currently admitted to the hospital and receiving intravenous antibiotics. The antibiotics were administered within an appropriate time frame prior to the initiation of the procedure. ANESTHESIA/SEDATION: 2.0 mg IV Versed 100 mcg IV Fentanyl Moderate Sedation Time:  15 MINUTES The patient was continuously monitored during the procedure by the interventional radiology nurse under my direct supervision. COMPLICATIONS: None immediate. TECHNIQUE: Informed written consent was obtained from the patient after a thorough discussion of the procedural risks, benefits and alternatives. All questions were addressed. Maximal Sterile Barrier Technique was utilized including caps, mask, sterile gowns, sterile gloves, sterile drape, hand hygiene and skin antiseptic. A timeout was performed prior to the initiation of the procedure. PROCEDURE: previous imaging reviewed. patient positioned supine. noncontrast localization ct performed. the left lower quadrant fluid collection along the pericolic gutter was localized. overlying skin marked. The LEFT LOWER QUADRANT was prepped with Chlorhexidine in a sterile fashion, and a sterile drape was applied covering the operative field. A sterile gown and sterile gloves were used for the procedure. Local anesthesia was provided with 1% Lidocaine. Under sterile conditions and local anesthesia, an 18 gauge 10 cm access needle was advanced percutaneously from an anterior oblique approach into the left lower quadrant diverticular abscess. Needle position confirmed with CT. Syringe aspiration yielded purulent fluid. Sample sent for Gram stain culture. Guidewire inserted followed by tract dilatation to insert a 10 [place] drain. Drain catheter position confirmed with CT. Syringe aspiration yielded 30 cc purulent fluid. Catheter secured  with a Prolene suture and a sterile dressing. External suction bulb connected. No immediate complication. Patient tolerated the procedure well. FINDINGS: CT imaging confirms percutaneous needle access for drain insertion into the left  lower quadrant diverticular abscess IMPRESSION: Successful left lower quadrant CT diverticular abscess drain Electronically Signed   By: Jerilynn Mages.  Shick M.D.   On: 02/20/2016 15:14    Labs:  CBC:  Recent Labs  02/19/16 1540 02/20/16 0320  WBC 20.6* 13.4*  HGB 10.8* 9.5*  HCT 33.6* 29.8*  PLT 530* 432*    COAGS:  Recent Labs  02/20/16 0320  INR 1.23    BMP:  Recent Labs  02/19/16 1540 02/20/16 0320  NA 135 137  K 3.7 3.4*  CL 100* 101  CO2 25 27  GLUCOSE 101* 100*  BUN 18 10  CALCIUM 9.0 8.8*  CREATININE 0.78 0.65  GFRNONAA >60 >60  GFRAA >60 >60    LIVER FUNCTION TESTS: No results for input(s): BILITOT, AST, ALT, ALKPHOS, PROT, ALBUMIN in the last 8760 hours.  Assessment and Plan: Diverticular abscess, s/p perc drain placement 1/16; labs today pend; drain fluid cx's pend; cont drain irrigation, monitor output; recheck CT once output minimal or within 1 week of drain placement   Electronically Signed: D. Rowe Robert 02/21/2016, 11:00 AM   I spent a total of 15 minutes at the the patient's bedside AND on the patient's hospital floor or unit, greater than 50% of which was counseling/coordinating care for left pelvic abscess drain    Patient ID: [name] [name], female   DOB: 04/10/1960, 56 y.o.   MRN: [id number]

## 2016-02-22 ENCOUNTER — Encounter (HOSPITAL_COMMUNITY): Payer: Self-pay

## 2016-02-22 ENCOUNTER — Inpatient Hospital Stay (HOSPITAL_COMMUNITY): Payer: BLUE CROSS/BLUE SHIELD

## 2016-02-22 LAB — GLUCOSE, CAPILLARY: Glucose-Capillary: 81 mg/dL (ref 65–99)

## 2016-02-22 LAB — BASIC METABOLIC PANEL
ANION GAP: 8 (ref 5–15)
BUN: 10 mg/dL (ref 6–20)
CALCIUM: 9 mg/dL (ref 8.9–10.3)
CHLORIDE: 102 mmol/L (ref 101–111)
CO2: 30 mmol/L (ref 22–32)
Creatinine, Ser: 0.69 mg/dL (ref 0.44–1.00)
GLUCOSE: 91 mg/dL (ref 65–99)
POTASSIUM: 4.7 mmol/L (ref 3.5–5.1)
Sodium: 140 mmol/L (ref 135–145)

## 2016-02-22 LAB — CBC
HEMATOCRIT: 30 % — AB (ref 36.0–46.0)
HEMOGLOBIN: 9.1 g/dL — AB (ref 12.0–15.0)
MCH: 25.7 pg — ABNORMAL LOW (ref 26.0–34.0)
MCHC: 30.3 g/dL (ref 30.0–36.0)
MCV: 84.7 fL (ref 78.0–100.0)
Platelets: 498 10*3/uL — ABNORMAL HIGH (ref 150–400)
RBC: 3.54 MIL/uL — AB (ref 3.87–5.11)
RDW: 14.7 % (ref 11.5–15.5)
WBC: 8.6 10*3/uL (ref 4.0–10.5)

## 2016-02-22 MED ORDER — OXYCODONE HCL 5 MG PO TABS
5.0000 mg | ORAL_TABLET | ORAL | Status: DC | PRN
  Administered 2016-02-22 – 2016-02-25 (×8): 5 mg via ORAL
  Filled 2016-02-22 (×8): qty 1

## 2016-02-22 MED ORDER — VANCOMYCIN HCL IN DEXTROSE 1-5 GM/200ML-% IV SOLN: 1000.0000 mg | Freq: Two times a day (BID) | INTRAVENOUS | Status: DC

## 2016-02-22 MED ORDER — IOPAMIDOL (ISOVUE-300) INJECTION 61%
INTRAVENOUS | Status: AC
  Administered 2016-02-22: 100 mL
  Filled 2016-02-22: qty 100

## 2016-02-22 MED ORDER — SODIUM CHLORIDE 0.9 % IV SOLN
2000.0000 mg | Freq: Once | INTRAVENOUS | Status: DC
  Filled 2016-02-22: qty 2000

## 2016-02-22 MED ORDER — VANCOMYCIN HCL IN DEXTROSE 1-5 GM/200ML-% IV SOLN
1000.0000 mg | Freq: Three times a day (TID) | INTRAVENOUS | Status: DC
  Administered 2016-02-22: 1000 mg via INTRAVENOUS
  Filled 2016-02-22 (×4): qty 200

## 2016-02-22 NOTE — Progress Notes (Addendum)
Patient ID: [name] [name], female   DOB: 1960-07-05, 56 y.o.   MRN: [id number]    PROGRESS NOTE    [name] [name]  [id number] DOB: 03-20-1960 DOA: 02/19/2016  PCP: Moshe Cipro, MD   Brief Narrative:  56 y.o. 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  Started running a fever over the weekend.  Went to PCP and they sent her to ED for evaluation.   Assessment & Plan:  Sepsis secondary to Abdominal abscess, preliminary culture with staph aureus  - Patient with a remote h/o perforated diverticulitis which eventually led to colostomy - Found to have a large 12 cm left paracolic gutter abscess - IR consulted for drainage - pt is now s/p CT drain in LLQ for abd abscess, appreciate IR team assistance  - continue zosyn day #4, added vancomycin today based on abscess culture report  - WBC is trending down form 20 K --> 13 K --> 8.6 repeat CBC in AM - tolerating regular diet well  - repeat CT abd today for follow up  Anemia of acute illness  - Hgb 10.8 (11.3 on 02/09/16, 12.1 on 01/24/16) - currently no signs of active bleeding  - CBC in AM  Hypokalemia - supplemented and WNL this AM  OSA - on CPAP  HTN, essential  - Continue Cozaar and Bystolic  Morbid obesity  - Body mass index is 51.36 kg/m  DVT prophylaxis: SCD's Code Status: Full  Family Communication: Patient at bedside  Disposition Plan: home in 1-2 days when final abscess cultures are back   Consultants:   IR  Procedures:   CT guided drain placement in LLQ 1/16  Antimicrobials:   Zosyn 1/15 -->  Subjective: Pain much improved, no N/V, less abd pain.  Objective: Vitals:   02/21/16 0440 02/21/16 1509 02/21/16 2204 02/22/16 0454  BP: (!) 93/57 97/60 (!) 105/57 (!) 94/55  Pulse: 73 75 75 62  Resp: 16 16 16 16   Temp: 97.7 F (36.5 C) 98.6 F (37 C) 98.2 F (36.8 C) 97.6  F (36.4 C)  TempSrc: Oral  Oral Oral  SpO2: 100% 97% 96% 98%  Weight:      Height:        Intake/Output Summary (Last 24 hours) at 02/22/16 1155 Last data filed at 02/22/16 0700  Gross per 24 hour  Intake                0 ml  Output               50 ml  Net              -50 ml   Filed Weights   02/19/16 1434  Weight: 119.3 kg (263 lb)    Examination:  General exam: Appears calm and comfortable  Respiratory system: Clear to auscultation. Respiratory effort normal. Cardiovascular system: S1 & S2 heard, RRR. No JVD, murmurs, rubs, gallops or clicks. No pedal edema. Gastrointestinal system: Abdomen is nondistended, mildly tender in LLQ Central nervous system: Alert and oriented. No focal neurological deficits. Extremities: Symmetric 5 x 5 power. Skin: No rashes, lesions or ulcers Psychiatry: Judgement and insight appear normal. Mood & affect appropriate.   Data Reviewed: I have personally reviewed following labs and imaging studies  CBC:  Recent Labs Lab 02/19/16 1540 02/20/16 0320 02/22/16 0359  WBC 20.6* 13.4* 8.6  NEUTROABS 16.6*  --   --  HGB 10.8* 9.5* 9.1*  HCT 33.6* 29.8* 30.0*  MCV 83.0 82.8 84.7  PLT 530* 432* [id number]*   Basic Metabolic Panel:  Recent Labs Lab 02/19/16 1540 02/20/16 0320 02/21/16 1235 02/22/16 0359  NA 135 137 137 140  K 3.7 3.4* 3.8 4.7  CL 100* 101 101 102  CO2 25 27 27 30   GLUCOSE 101* 100* 110* 91  BUN 18 10 15 10   CREATININE 0.78 0.65 0.64 0.69  CALCIUM 9.0 8.8* 8.7* 9.0   Coagulation Profile:  Recent Labs Lab 02/20/16 0320  INR 1.23   Urine analysis:    Component Value Date/Time   COLORURINE AMBER (A) 02/19/2016 1636   APPEARANCEUR TURBID (A) 02/19/2016 1636   LABSPEC 1.025 02/19/2016 1636   PHURINE 5.0 02/19/2016 1636   GLUCOSEU NEGATIVE 02/19/2016 1636   HGBUR NEGATIVE 02/19/2016 1636   BILIRUBINUR NEGATIVE 02/19/2016 1636   KETONESUR NEGATIVE 02/19/2016 1636   PROTEINUR NEGATIVE 02/19/2016 1636   NITRITE  NEGATIVE 02/19/2016 1636   LEUKOCYTESUR NEGATIVE 02/19/2016 1636   Radiology Studies: Ct Image Guided Drainage By Percutaneous Catheter  Result Date: 02/20/2016 INDICATION: Diverticular abscess, left lower quadrant EXAM: CT GUIDED DRAINAGE OF DIVERTICULAR ABSCESS MEDICATIONS: The patient is currently admitted to the hospital and receiving intravenous antibiotics. The antibiotics were administered within an appropriate time frame prior to the initiation of the procedure. ANESTHESIA/SEDATION: 2.0 mg IV Versed 100 mcg IV Fentanyl Moderate Sedation Time:  15 MINUTES The patient was continuously monitored during the procedure by the interventional radiology nurse under my direct supervision. COMPLICATIONS: None immediate. TECHNIQUE: Informed written consent was obtained from the patient after a thorough discussion of the procedural risks, benefits and alternatives. All questions were addressed. Maximal Sterile Barrier Technique was utilized including caps, mask, sterile gowns, sterile gloves, sterile drape, hand hygiene and skin antiseptic. A timeout was performed prior to the initiation of the procedure. PROCEDURE: previous imaging reviewed. patient positioned supine. noncontrast localization ct performed. the left lower quadrant fluid collection along the pericolic gutter was localized. overlying skin marked. The LEFT LOWER QUADRANT was prepped with Chlorhexidine in a sterile fashion, and a sterile drape was applied covering the operative field. A sterile gown and sterile gloves were used for the procedure. Local anesthesia was provided with 1% Lidocaine. Under sterile conditions and local anesthesia, an 18 gauge 10 cm access needle was advanced percutaneously from an anterior oblique approach into the left lower quadrant diverticular abscess. Needle position confirmed with CT. Syringe aspiration yielded purulent fluid. Sample sent for Gram stain culture. Guidewire inserted followed by tract dilatation to insert  a 10 [place] drain. Drain catheter position confirmed with CT. Syringe aspiration yielded 30 cc purulent fluid. Catheter secured with a Prolene suture and a sterile dressing. External suction bulb connected. No immediate complication. Patient tolerated the procedure well. FINDINGS: CT imaging confirms percutaneous needle access for drain insertion into the left lower quadrant diverticular abscess IMPRESSION: Successful left lower quadrant CT diverticular abscess drain Electronically Signed   By: Jerilynn Mages.  Shick M.D.   On: 02/20/2016 15:14   Scheduled Meds: . atorvastatin  10 mg Oral QHS  . losartan  50 mg Oral Daily  . mometasone-formoterol  2 puff Inhalation BID  . nebivolol  5 mg Oral Daily  . pantoprazole  40 mg Oral Daily  . piperacillin-tazobactam (ZOSYN)  IV  3.375 g Intravenous Q8H  . pramipexole  0.5 mg Oral QHS  . venlafaxine XR  150 mg Oral Q breakfast   Continuous Infusions:  LOS: 3 days   Time spent: 20 minutes   Faye Ramsay, MD [facility] Pager [phone]  If 7PM-7AM, please contact night-coverage [email or web address] Password [facility] 02/22/2016, 11:55 AM

## 2016-02-22 NOTE — Progress Notes (Signed)
Referring Physician(s): Dr Garwin Brothers  Supervising Physician: Markus Daft  Patient Status:  [facility] - In-pt  Chief Complaint:  LLQ diverticular abscess drain placed 1`/16  Subjective:  OP still milky brown 50 cc yesterday 20 cc in JP Still significant OP Complains of continued low abd pain---altho better   Allergies: Penicillins; Cortisone; Crestor [rosuvastatin calcium]; Dilaudid [hydromorphone hcl]; Neosporin [neomycin-bacitracin zn-polymyx]; and Vancomycin  Medications: Prior to Admission medications   Medication Sig Start Date End Date Taking? Authorizing Provider  albuterol (PROVENTIL HFA;VENTOLIN HFA) 108 (90 BASE) MCG/ACT inhaler Inhale into the lungs every 6 (six) hours as needed for wheezing or shortness of breath.   Yes Historical Provider, MD  atorvastatin (LIPITOR) 10 MG tablet Take 10 mg by mouth at bedtime.    Yes Historical Provider, MD  Calcium Carbonate-Vitamin D 600-400 MG-UNIT tablet Take 1 tablet by mouth daily.    Yes Historical Provider, MD  Fluticasone-Salmeterol (ADVAIR) 250-50 MCG/DOSE AEPB Inhale 1 puff into the lungs 2 (two) times daily.   Yes Historical Provider, MD  losartan (COZAAR) 50 MG tablet Take 50 mg by mouth daily.   Yes Historical Provider, MD  nebivolol (BYSTOLIC) 5 MG tablet Take 5 mg by mouth daily.   Yes Historical Provider, MD  oxyCODONE-acetaminophen (PERCOCET/ROXICET) 5-325 MG tablet Take 1 tablet by mouth every 6 (six) hours as needed for moderate pain.  02/09/16  Yes Historical Provider, MD  pantoprazole (PROTONIX) 40 MG tablet Take 40 mg by mouth daily.   Yes Historical Provider, MD  polyethylene glycol powder (GLYCOLAX/MIRALAX) powder MIX 1 HEAPING TABLESPOONFUL IN 8 OUNCES OF A LIQUID AND DRINK BID PRF CONSTIPATION 02/09/16  Yes Historical Provider, MD  pramipexole (MIRAPEX) 0.5 MG tablet Take 0.25 mg by mouth at bedtime.    Yes Historical Provider, MD  pramipexole (MIRAPEX) 0.5 MG tablet Take 0.5 mg by mouth at bedtime.    Yes  Historical Provider, MD  venlafaxine XR (EFFEXOR-XR) 150 MG 24 hr capsule Take 150 mg by mouth daily with breakfast.   Yes Historical Provider, MD     Vital Signs: BP (!) 94/55   Pulse 62   Temp 97.6 F (36.4 C) (Oral)   Resp 16   Ht 5' (1.524 m)   Wt 263 lb (119.3 kg)   LMP 08/04/2013   SpO2 98%   BMI 51.36 kg/m   Physical Exam  Constitutional: She is oriented to person, place, and time.  Abdominal: Soft. Bowel sounds are normal.  Musculoskeletal: Normal range of motion.  Neurological: She is alert and oriented to person, place, and time.  Skin: Skin is warm and dry.  Site of drain is clean and dry NT at site No bleeding OP milky brown color 50 cc yesterday 20 cc in JP + staph aureus afeb Wbc wnl   Psychiatric: She has a normal mood and affect. Her behavior is normal.  Nursing note and vitals reviewed.   Imaging: Ct Abdomen Pelvis W Contrast  Result Date: 02/19/2016 CLINICAL DATA:  Left abdominal pain for several months, worsening last 2-3 days, elevated white blood cell count, history of perforated colon and diverticulitis. EXAM: CT ABDOMEN AND PELVIS WITH CONTRAST TECHNIQUE: Multidetector CT imaging of the abdomen and pelvis was performed using the standard protocol following bolus administration of intravenous contrast. CONTRAST:  150mL ISOVUE-300 IOPAMIDOL (ISOVUE-300) INJECTION 61% COMPARISON:  None. FINDINGS: Lower chest: No acute abnormality. Hepatobiliary: No focal liver abnormality is seen. Probable punctate stone within the gallbladder. No gallbladder wall thickening or pericholecystic fluid.  No bile duct dilatation seen. Pancreas: Unremarkable. No pancreatic ductal dilatation or surrounding inflammatory changes. Spleen: Normal in size without focal abnormality. Adrenals/Urinary Tract: Adrenal glands appear normal. Benign appearing cyst within the lateral cortex of the left kidney measures 2.6 x 2.5 cm. Kidneys otherwise unremarkable bilaterally without stone or  hydronephrosis. No ureteral or bladder calculi identified. Bladder is unremarkable, decompressed. 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 No dilated large or small bowel loops. Appendix is normal. Vascular/Lymphatic: Scattered mild atherosclerotic changes of the normal caliber abdominal aorta. No enlarged lymph nodes appreciated in the abdomen or pelvis. Reproductive: Uterus and bilateral adnexa are unremarkable. Other: There is a complex intra-abdominal fluid collection within the left paracolic gutter, measuring approximately 12 cm craniocaudal dimension and 2.5 cm greatest thickness, with surrounding inflammatory fluid stranding. No free intraperitoneal air appreciated. Musculoskeletal: Ill-defined edema within the subcutaneous soft tissues of the left lateral abdominal wall, possibly reactive to and/or contiguous with the fluid collection in the underlying paracolic gutter. No acute or suspicious osseous finding. IMPRESSION: 1. Complex intra-abdominal fluid collection within the left paracolic gutter, with a walled-off appearance, measuring approximately 12 cm craniocaudal dimension and 2.5 cm greatest thickness, with surrounding inflammatory fluid stranding, presumed abscess collection. No obvious source for the presumed abscess. No air is seen within the collection to confirm abscess or to suggest fistulous connection with adjacent bowel. 2. Ill-defined edema/fluid stranding within the subcutaneous soft tissues of the left lateral abdominal wall, and thickening/edema of the intervening abdominal wall musculature, almost certainly reactive to and/or contiguous with the presumed abscess collection in the underlying paracolic gutter. No circumscribed fluid collection is seen within these superficial soft tissues. 3. Surgical changes of previous partial colectomy with left anterior abdominal wall colostomy creation. No evidence of obstruction or  other surgical complicating feature at the colostomy site. 4. Aortic atherosclerosis. Probable punctate gallstone within the otherwise normal-appearing gallbladder. Additional chronic/incidental findings detailed above. These results were called by telephone at the time of interpretation on 02/19/2016 at 5:59 pm to Dr. Davonna Belling , who verbally acknowledged these results. Electronically Signed   By: Franki Cabot M.D.   On: 02/19/2016 18:06   Ct Image Guided Drainage By Percutaneous Catheter  Result Date: 02/20/2016 INDICATION: Diverticular abscess, left lower quadrant EXAM: CT GUIDED DRAINAGE OF DIVERTICULAR ABSCESS MEDICATIONS: The patient is currently admitted to the hospital and receiving intravenous antibiotics. The antibiotics were administered within an appropriate time frame prior to the initiation of the procedure. ANESTHESIA/SEDATION: 2.0 mg IV Versed 100 mcg IV Fentanyl Moderate Sedation Time:  15 MINUTES The patient was continuously monitored during the procedure by the interventional radiology nurse under my direct supervision. COMPLICATIONS: None immediate. TECHNIQUE: Informed written consent was obtained from the patient after a thorough discussion of the procedural risks, benefits and alternatives. All questions were addressed. Maximal Sterile Barrier Technique was utilized including caps, mask, sterile gowns, sterile gloves, sterile drape, hand hygiene and skin antiseptic. A timeout was performed prior to the initiation of the procedure. PROCEDURE: previous imaging reviewed. patient positioned supine. noncontrast localization ct performed. the left lower quadrant fluid collection along the pericolic gutter was localized. overlying skin marked. The LEFT LOWER QUADRANT was prepped with Chlorhexidine in a sterile fashion, and a sterile drape was applied covering the operative field. A sterile gown and sterile gloves were used for the procedure. Local anesthesia was provided with 1% Lidocaine.  Under sterile conditions and local anesthesia, an 18 gauge 10 cm access  needle was advanced percutaneously from an anterior oblique approach into the left lower quadrant diverticular abscess. Needle position confirmed with CT. Syringe aspiration yielded purulent fluid. Sample sent for Gram stain culture. Guidewire inserted followed by tract dilatation to insert a 10 [place] drain. Drain catheter position confirmed with CT. Syringe aspiration yielded 30 cc purulent fluid. Catheter secured with a Prolene suture and a sterile dressing. External suction bulb connected. No immediate complication. Patient tolerated the procedure well. FINDINGS: CT imaging confirms percutaneous needle access for drain insertion into the left lower quadrant diverticular abscess IMPRESSION: Successful left lower quadrant CT diverticular abscess drain Electronically Signed   By: Jerilynn Mages.  Shick M.D.   On: 02/20/2016 15:14    Labs:  CBC:  Recent Labs  02/19/16 1540 02/20/16 0320 02/22/16 0359  WBC 20.6* 13.4* 8.6  HGB 10.8* 9.5* 9.1*  HCT 33.6* 29.8* 30.0*  PLT 530* 432* 498*    COAGS:  Recent Labs  02/20/16 0320  INR 1.23    BMP:  Recent Labs  02/19/16 1540 02/20/16 0320 02/21/16 1235 02/22/16 0359  NA 135 137 137 140  K 3.7 3.4* 3.8 4.7  CL 100* 101 101 102  CO2 25 27 27 30   GLUCOSE 101* 100* 110* 91  BUN 18 10 15 10   CALCIUM 9.0 8.8* 8.7* 9.0  CREATININE 0.78 0.65 0.64 0.69  GFRNONAA >60 >60 >60 >60  GFRAA >60 >60 >60 >60    LIVER FUNCTION TESTS: No results for input(s): BILITOT, AST, ALT, ALKPHOS, PROT, ALBUMIN in the last 8760 hours.  Assessment and Plan:  LLQ diverticular abscess drain intact Will follow Will need CT and drain inj before removal Can follow in OP IR drain clinic Let us know if pt goes home with drain  Electronically Signed: Dakotah Orrego A 02/22/2016, 11:17 AM   I spent a total of 15 Minutes at the the patient's bedside AND on the patient's hospital floor or unit,  greater than 50% of which was counseling/coordinating care for LLQ abscess drain

## 2016-02-22 NOTE — Progress Notes (Signed)
Pharmacy Antibiotic Note  [name] [name] is a 56 y.o. female admitted on 02/19/2016 with intra-abdominal infection.  Pharmacy has been consulted for Zosyn dosing.  S/p drainage and perc drain placement in IR 1/16. WBC has improved. Drainage growing staph aureus  Plan: Zosyn 3.375g IV q8h (4 hour infusion).  Vancomycin 2g IV x1, then 1g IV q8h based on obesity nomogram Follow c/s, renal function, length of therapy, ability to de-escalate  Height: 5' (152.4 cm) Weight: 263 lb (119.3 kg) IBW/kg (Calculated) : 45.5  Temp (24hrs), Avg:98.1 F (36.7 C), Min:97.6 F (36.4 C), Max:98.6 F (37 C)   Recent Labs Lab 02/19/16 1540 02/20/16 0320 02/21/16 1235 02/22/16 0359  WBC 20.6* 13.4*  --  8.6  CREATININE 0.78 0.65 0.64 0.69    Estimated Creatinine Clearance: 95.2 mL/min (by C-G formula based on SCr of 0.69 mg/dL).    Allergies  Allergen Reactions  . Penicillins Shortness Of Breath    Tolerates Zosyn  . Cortisone Hives  . Crestor [Rosuvastatin Calcium] Other (See Comments)    "started messing up my liver"  . Dilaudid [Hydromorphone Hcl] Other (See Comments)    Respiratory depression  . Neosporin [Neomycin-Bacitracin Zn-Polymyx] Hives  . Vancomycin Nausea And Vomiting    Antimicrobials this admission: Zosyn 1/15 >>  Vancomycin 1/18 >>   Dose adjustments this admission: n/a  Microbiology results: 1/16 abdominal abscess: abundant staph aureus  Thank you for allowing pharmacy to be a part of this patient's care.   [name] [name], PharmD, BCPS Clinical Pharmacist Pager: [phone] 02/22/2016 12:07 PM

## 2016-02-23 ENCOUNTER — Encounter (HOSPITAL_COMMUNITY): Payer: Self-pay | Admitting: General Practice

## 2016-02-23 LAB — CK: Total CK: 20 U/L — ABNORMAL LOW (ref 38–234)

## 2016-02-23 LAB — CBC
HEMATOCRIT: 33 % — AB (ref 36.0–46.0)
HEMOGLOBIN: 10.5 g/dL — AB (ref 12.0–15.0)
MCH: 26.4 pg (ref 26.0–34.0)
MCHC: 31.8 g/dL (ref 30.0–36.0)
MCV: 82.9 fL (ref 78.0–100.0)
Platelets: 532 10*3/uL — ABNORMAL HIGH (ref 150–400)
RBC: 3.98 MIL/uL (ref 3.87–5.11)
RDW: 14.8 % (ref 11.5–15.5)
WBC: 12.5 10*3/uL — AB (ref 4.0–10.5)

## 2016-02-23 LAB — BASIC METABOLIC PANEL
ANION GAP: 13 (ref 5–15)
BUN: 10 mg/dL (ref 6–20)
CALCIUM: 9.3 mg/dL (ref 8.9–10.3)
CHLORIDE: 101 mmol/L (ref 101–111)
CO2: 26 mmol/L (ref 22–32)
Creatinine, Ser: 0.74 mg/dL (ref 0.44–1.00)
GFR calc Af Amer: >60 mL/min (ref >60)
GFR calc non Af Amer: >60 mL/min (ref >60)
Glucose, Bld: 105 mg/dL — ABNORMAL HIGH (ref 65–99)
POTASSIUM: 4 mmol/L (ref 3.5–5.1)
Sodium: 140 mmol/L (ref 135–145)

## 2016-02-23 MED ORDER — SODIUM CHLORIDE 0.9 % IV SOLN
450.0000 mg | INTRAVENOUS | Status: DC
  Administered 2016-02-23: 450 mg via INTRAVENOUS
  Filled 2016-02-23: qty 9

## 2016-02-23 MED ORDER — ACETAMINOPHEN 325 MG PO TABS
650.0000 mg | ORAL_TABLET | Freq: Once | ORAL | Status: AC
  Administered 2016-02-23: 650 mg via ORAL
  Filled 2016-02-23: qty 2

## 2016-02-23 MED ORDER — ACETAMINOPHEN 325 MG PO TABS: 650.0000 mg | ORAL_TABLET | Freq: Four times a day (QID) | ORAL | Status: DC | PRN

## 2016-02-23 MED ORDER — MORPHINE SULFATE (PF) 4 MG/ML IV SOLN
4.0000 mg | Freq: Once | INTRAVENOUS | Status: AC
  Administered 2016-02-23: 4 mg via INTRAVENOUS
  Filled 2016-02-23: qty 1

## 2016-02-23 MED ORDER — HYDROMORPHONE HCL 2 MG/ML IJ SOLN: 1.0000 mg | Freq: Once | INTRAMUSCULAR | Status: DC

## 2016-02-23 MED ORDER — SODIUM CHLORIDE 0.9 % IV SOLN
500.0000 mg | INTRAVENOUS | Status: DC
  Filled 2016-02-23: qty 10

## 2016-02-23 MED ORDER — LOSARTAN POTASSIUM 25 MG PO TABS
25.0000 mg | ORAL_TABLET | Freq: Every day | ORAL | Status: DC
Start: 2016-02-24 — End: 2016-02-25
  Administered 2016-02-24 – 2016-02-25 (×2): 25 mg via ORAL
  Filled 2016-02-23 (×2): qty 1

## 2016-02-23 NOTE — Progress Notes (Signed)
Patient called for nurse and on assessment, patient noted shivering and saying she felt nauseous.VS taken, T 102 BP 117/101. Manual BP 98.70. Charge nurse was notified: Zofran was given for nausea and Rapid Responds also notified. Rapid did an assessment on patient. MD on call noted and received an order for morphine and tylenol.

## 2016-02-23 NOTE — Progress Notes (Addendum)
Pharmacy Antibiotic Note  [name] [name] is a 56 y.o. female admitted on [id number] with paracolic gutter abscess with perc drain in place - culture growing MRSA.  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 Zosyn has been discontinued. Blood cultures are pending.   Plan: Daptomycin 6mg /kg IV every 24 hours (used ABW for obesity). CPK now then weekly while on therapy. Monitor renal function, clinical status, and culture results.  If blood cultures are positive, consider increasing dosing based on TBW.   Height: 5' (152.4 cm) Weight: 263 lb (119.3 kg) IBW/kg (Calculated) : 45.5  ABW/kg (Calculated): 75 kg  Temp (24hrs), Avg:99.8 F (37.7 C), Min:98.2 F (36.8 C), Max:102.8 F (39.3 C)   Recent Labs Lab 02/19/16 1540 02/20/16 0320 02/21/16 1235 02/22/16 0359 02/23/16 0402  WBC 20.6* 13.4*  --  8.6 12.5*  CREATININE 0.78 0.65 0.64 0.69 0.74    Estimated Creatinine Clearance: 95.2 mL/min (by C-G formula based on SCr of 0.74 mg/dL).    Allergies  Allergen Reactions  . Penicillins Shortness Of Breath    Tolerates Zosyn  . Cortisone Hives  . Crestor [Rosuvastatin Calcium] Other (See Comments)    "started messing up my liver"  . Dilaudid [Hydromorphone Hcl] Other (See Comments)    Respiratory depression  . Neosporin [Neomycin-Bacitracin Zn-Polymyx] Hives  . Vancomycin Nausea And Vomiting    And Red Man's Syndrome    Antimicrobials this admission: Zosyn 1/15 >> 1/19 Vanc 1/18>> 1/19 d/t pt complaints of nausea despite lower rate for red man's syndrome Daptomycin 1/19 >>   Dose adjustments this admission: n/a   Microbiology results: 1/16 abdominal abscess: MRSA 1/19 Blood >>   Thank you for allowing pharmacy to be a part of this patient's care.  Sloan Leiter, PharmD, BCPS Clinical Pharmacist Clinical phone 02/23/2016 until 3:30 PM [phone] After hours, please call #[phone] 02/23/2016  11:47 AM

## 2016-02-23 NOTE — Progress Notes (Signed)
Referring Physician(s): Dr Garwin Brothers  Supervising Physician: Marybelle Killings  Patient Status:  [facility] - In-pt  Chief Complaint:  LLQ diverticular abscess drain placed 1/16  Subjective:  CT 1/18: IMPRESSION: Near complete resolution of left abdominal abscess following percutaneous catheter drainage  100.6 last pm Output 50 cc yesterday 30 cc today 15 cc in JP now---milky thin fluid  Less painful today   Allergies: Penicillins; Cortisone; Crestor [rosuvastatin calcium]; Dilaudid [hydromorphone hcl]; Neosporin [neomycin-bacitracin zn-polymyx]; and Vancomycin  Medications: Prior to Admission medications   Medication Sig Start Date End Date Taking? Authorizing Provider  albuterol (PROVENTIL HFA;VENTOLIN HFA) 108 (90 BASE) MCG/ACT inhaler Inhale into the lungs every 6 (six) hours as needed for wheezing or shortness of breath.   Yes Historical Provider, MD  atorvastatin (LIPITOR) 10 MG tablet Take 10 mg by mouth at bedtime.    Yes Historical Provider, MD  Calcium Carbonate-Vitamin D 600-400 MG-UNIT tablet Take 1 tablet by mouth daily.    Yes Historical Provider, MD  Fluticasone-Salmeterol (ADVAIR) 250-50 MCG/DOSE AEPB Inhale 1 puff into the lungs 2 (two) times daily.   Yes Historical Provider, MD  losartan (COZAAR) 50 MG tablet Take 50 mg by mouth daily.   Yes Historical Provider, MD  nebivolol (BYSTOLIC) 5 MG tablet Take 5 mg by mouth daily.   Yes Historical Provider, MD  oxyCODONE-acetaminophen (PERCOCET/ROXICET) 5-325 MG tablet Take 1 tablet by mouth every 6 (six) hours as needed for moderate pain.  02/09/16  Yes Historical Provider, MD  pantoprazole (PROTONIX) 40 MG tablet Take 40 mg by mouth daily.   Yes Historical Provider, MD  polyethylene glycol powder (GLYCOLAX/MIRALAX) powder MIX 1 HEAPING TABLESPOONFUL IN 8 OUNCES OF A LIQUID AND DRINK BID PRF CONSTIPATION 02/09/16  Yes Historical Provider, MD  pramipexole (MIRAPEX) 0.5 MG tablet Take 0.25 mg by mouth at bedtime.    Yes  Historical Provider, MD  pramipexole (MIRAPEX) 0.5 MG tablet Take 0.5 mg by mouth at bedtime.    Yes Historical Provider, MD  venlafaxine XR (EFFEXOR-XR) 150 MG 24 hr capsule Take 150 mg by mouth daily with breakfast.   Yes Historical Provider, MD     Vital Signs: BP (!) 95/57 (BP Location: Left Arm)   Pulse (!) 122   Temp 98.8 F (37.1 C)   Resp 18   Ht 5' (1.524 m)   Wt 263 lb (119.3 kg)   LMP 08/04/2013   SpO2 93%   BMI 51.36 kg/m   Physical Exam  Constitutional: She is oriented to person, place, and time.  Abdominal: Soft. Bowel sounds are normal. There is no tenderness.  Neurological: She is alert and oriented to person, place, and time.  Skin: Skin is warm and dry.  Skin site is clean and dry' NT No bleeding OP 30 cc with 125 cc in JP Milky thin fluid + staph   Psychiatric: She has a normal mood and affect.  Nursing note and vitals reviewed.   Imaging: Ct Abdomen Pelvis W Contrast  Result Date: 02/22/2016 CLINICAL DATA:  Followup diverticular abscess. Status post percutaneous catheter drainage. EXAM: CT ABDOMEN AND PELVIS WITH CONTRAST TECHNIQUE: Multidetector CT imaging of the abdomen and pelvis was performed using the standard protocol following bolus administration of intravenous contrast. CONTRAST:  189mL ISOVUE-300 IOPAMIDOL (ISOVUE-300) INJECTION 61% COMPARISON:  02/20/2016 FINDINGS: Lower Chest: No acute findings. Hepatobiliary:  No masses identified. Gallbladder is unremarkable. Pancreas:  No mass or inflammatory changes. Spleen: Within normal limits in size and appearance. Adrenals/Urinary Tract: No masses  identified. Small fluid attenuation left renal cyst noted. No evidence of hydronephrosis. Stomach/Bowel: Previous partial left colectomy with transverse colostomy again seen. Left abdominal percutaneous drainage catheter is seen in place. Adjacent inflammatory changes are seen, however the previously seen fluid collection at this location is nearly completely  resolved. A small residual collection is seen superior to the catheter which measures approximately 2.3 x 1.8 by 5.3 cm. No new fluid collections identified within abdomen or pelvis. Vascular/Lymphatic: No pathologically enlarged lymph nodes. No abdominal aortic aneurysm. Reproductive:  No mass identified. Other:  None. Musculoskeletal:  No suspicious bone lesions identified. IMPRESSION: Near complete resolution of left abdominal abscess following percutaneous catheter drainage. Electronically Signed   By: Earle Gell M.D.   On: 02/22/2016 13:16   Ct Abdomen Pelvis W Contrast  Result Date: 02/19/2016 CLINICAL DATA:  Left abdominal pain for several months, worsening last 2-3 days, elevated white blood cell count, history of perforated colon and diverticulitis. EXAM: CT ABDOMEN AND PELVIS WITH CONTRAST TECHNIQUE: Multidetector CT imaging of the abdomen and pelvis was performed using the standard protocol following bolus administration of intravenous contrast. CONTRAST:  119mL ISOVUE-300 IOPAMIDOL (ISOVUE-300) INJECTION 61% COMPARISON:  None. FINDINGS: Lower chest: No acute abnormality. Hepatobiliary: No focal liver abnormality is seen. Probable punctate stone within the gallbladder. No gallbladder wall thickening or pericholecystic fluid. No bile duct dilatation seen. Pancreas: Unremarkable. No pancreatic ductal dilatation or surrounding inflammatory changes. Spleen: Normal in size without focal abnormality. Adrenals/Urinary Tract: Adrenal glands appear normal. Benign appearing cyst within the lateral cortex of the left kidney measures 2.6 x 2.5 cm. Kidneys otherwise unremarkable bilaterally without stone or hydronephrosis. No ureteral or bladder calculi identified. Bladder is unremarkable, decompressed. 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 No dilated large or small bowel loops. Appendix is normal. Vascular/Lymphatic: Scattered mild  atherosclerotic changes of the normal caliber abdominal aorta. No enlarged lymph nodes appreciated in the abdomen or pelvis. Reproductive: Uterus and bilateral adnexa are unremarkable. Other: There is a complex intra-abdominal fluid collection within the left paracolic gutter, measuring approximately 12 cm craniocaudal dimension and 2.5 cm greatest thickness, with surrounding inflammatory fluid stranding. No free intraperitoneal air appreciated. Musculoskeletal: Ill-defined edema within the subcutaneous soft tissues of the left lateral abdominal wall, possibly reactive to and/or contiguous with the fluid collection in the underlying paracolic gutter. No acute or suspicious osseous finding. IMPRESSION: 1. Complex intra-abdominal fluid collection within the left paracolic gutter, with a walled-off appearance, measuring approximately 12 cm craniocaudal dimension and 2.5 cm greatest thickness, with surrounding inflammatory fluid stranding, presumed abscess collection. No obvious source for the presumed abscess. No air is seen within the collection to confirm abscess or to suggest fistulous connection with adjacent bowel. 2. Ill-defined edema/fluid stranding within the subcutaneous soft tissues of the left lateral abdominal wall, and thickening/edema of the intervening abdominal wall musculature, almost certainly reactive to and/or contiguous with the presumed abscess collection in the underlying paracolic gutter. No circumscribed fluid collection is seen within these superficial soft tissues. 3. Surgical changes of previous partial colectomy with left anterior abdominal wall colostomy creation. No evidence of obstruction or other surgical complicating feature at the colostomy site. 4. Aortic atherosclerosis. Probable punctate gallstone within the otherwise normal-appearing gallbladder. Additional chronic/incidental findings detailed above. These results were called by telephone at the time of interpretation on 02/19/2016  at 5:59 pm to Dr. Davonna Belling , who verbally acknowledged these results. Electronically Signed   By: Roxy Horseman.D.  On: 02/19/2016 18:06   Ct Image Guided Drainage By Percutaneous Catheter  Result Date: 02/20/2016 INDICATION: Diverticular abscess, left lower quadrant EXAM: CT GUIDED DRAINAGE OF DIVERTICULAR ABSCESS MEDICATIONS: The patient is currently admitted to the hospital and receiving intravenous antibiotics. The antibiotics were administered within an appropriate time frame prior to the initiation of the procedure. ANESTHESIA/SEDATION: 2.0 mg IV Versed 100 mcg IV Fentanyl Moderate Sedation Time:  15 MINUTES The patient was continuously monitored during the procedure by the interventional radiology nurse under my direct supervision. COMPLICATIONS: None immediate. TECHNIQUE: Informed written consent was obtained from the patient after a thorough discussion of the procedural risks, benefits and alternatives. All questions were addressed. Maximal Sterile Barrier Technique was utilized including caps, mask, sterile gowns, sterile gloves, sterile drape, hand hygiene and skin antiseptic. A timeout was performed prior to the initiation of the procedure. PROCEDURE: previous imaging reviewed. patient positioned supine. noncontrast localization ct performed. the left lower quadrant fluid collection along the pericolic gutter was localized. overlying skin marked. The LEFT LOWER QUADRANT was prepped with Chlorhexidine in a sterile fashion, and a sterile drape was applied covering the operative field. A sterile gown and sterile gloves were used for the procedure. Local anesthesia was provided with 1% Lidocaine. Under sterile conditions and local anesthesia, an 18 gauge 10 cm access needle was advanced percutaneously from an anterior oblique approach into the left lower quadrant diverticular abscess. Needle position confirmed with CT. Syringe aspiration yielded purulent fluid. Sample sent for Gram stain  culture. Guidewire inserted followed by tract dilatation to insert a 10 [place] drain. Drain catheter position confirmed with CT. Syringe aspiration yielded 30 cc purulent fluid. Catheter secured with a Prolene suture and a sterile dressing. External suction bulb connected. No immediate complication. Patient tolerated the procedure well. FINDINGS: CT imaging confirms percutaneous needle access for drain insertion into the left lower quadrant diverticular abscess IMPRESSION: Successful left lower quadrant CT diverticular abscess drain Electronically Signed   By: Jerilynn Mages.  Shick M.D.   On: 02/20/2016 15:14    Labs:  CBC:  Recent Labs  02/19/16 1540 02/20/16 0320 02/22/16 0359 02/23/16 0402  WBC 20.6* 13.4* 8.6 12.5*  HGB 10.8* 9.5* 9.1* 10.5*  HCT 33.6* 29.8* 30.0* 33.0*  PLT 530* 432* 498* 532*    COAGS:  Recent Labs  02/20/16 0320  INR 1.23    BMP:  Recent Labs  02/20/16 0320 02/21/16 1235 02/22/16 0359 02/23/16 0402  NA 137 137 140 140  K 3.4* 3.8 4.7 4.0  CL 101 101 102 101  CO2 27 27 30 26   GLUCOSE 100* 110* 91 105*  BUN 10 15 10 10   CALCIUM 8.8* 8.7* 9.0 9.3  CREATININE 0.65 0.64 0.69 0.74  GFRNONAA >60 >60 >60 >60  GFRAA >60 >60 >60 >60    LIVER FUNCTION TESTS: No results for input(s): BILITOT, AST, ALT, ALKPHOS, PROT, ALBUMIN in the last 8760 hours.  Assessment and Plan:  LLQ abscess drain intact OP still in 30 cc range Wbc up to 12.5 today 100.6 last pm Would not remove drain at this point If pt goes home with drain---let IR know We will follow in OP Drain Clinic  Electronically Signed: Perez Dirico A 02/23/2016, 11:28 AM   I spent a total of 15 Minutes at the the patient's bedside AND on the patient's hospital floor or unit, greater than 50% of which was counseling/coordinating care for LLQ abscess drain

## 2016-02-23 NOTE — Progress Notes (Signed)
Patient ID: [name] [name], female   DOB: September 13, 1960, 56 y.o.   MRN: [id number]    PROGRESS NOTE    [name] [name]  [id number] DOB: 1960-11-13 DOA: 02/19/2016  PCP: [name] Cipro, MD   Brief Narrative:  56 y.o. 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  Started running a fever over the weekend.  Went to PCP and they sent her to ED for evaluation.   Assessment & Plan:  Sepsis secondary to Abdominal abscess, preliminary culture with MRSA - Patient with a remote h/o perforated diverticulitis which eventually led to colostomy - Found to have a large 12 cm left paracolic gutter abscess - IR consulted for drainage - pt is now s/p CT drain in LLQ for abd abscess, appreciate IR team assistance  - vancomycin was added to zosyn but cultures now with MRSA so since pt can not tolerate Vancomycin, ID doctor approved daptomycin  - WBC is trending down form 20 K --> 13 K --> 8.6 --> 12 K this AM - today is day #1 of efficient ABX treatment   Anemia of acute illness  - Hgb 10.8 (11.3 on 02/09/16, 12.1 on 01/24/16) - currently no signs of active bleeding  - CBC in AM  Hypokalemia - supplemented and WNL this AM  OSA - on CPAP  HTN, essential  - Continue Cozaar and Bystolic  Morbid obesity  - Body mass index is 51.36 kg/m  DVT prophylaxis: SCD's Code Status: Full  Family Communication: Patient at bedside  Disposition Plan: home in 1-2 days when WBC down and pt with no fevers   Consultants:   IR  ID over the phone   Procedures:   CT guided drain placement in LLQ 1/16  Antimicrobials:   Zosyn 1/15 -->  Subjective: Pain much improved, no N/V, less abd pain.  Objective: Vitals:   02/23/16 0438 02/23/16 0759 02/23/16 1000 02/23/16 1500  BP: (!) 95/57     Pulse: (!) 122   77  Resp: 18   18  Temp: (!) 100.6 F (38.1 C) 98.8 F (37.1 C)  99.1  F (37.3 C)  TempSrc: Oral   Oral  SpO2: 93%  93% 94%  Weight:      Height:        Intake/Output Summary (Last 24 hours) at 02/23/16 1915 Last data filed at 02/23/16 1100  Gross per 24 hour  Intake              245 ml  Output                5 ml  Net              240 ml   Filed Weights   02/19/16 1434  Weight: 119.3 kg (263 lb)    Examination:  General exam: Appears calm and comfortable  Respiratory system: Clear to auscultation. Respiratory effort normal. Cardiovascular system: S1 & S2 heard, RRR. No JVD, murmurs, rubs, gallops or clicks. No pedal edema. Gastrointestinal system: Abdomen is nondistended, mildly tender in LLQ Central nervous system: Alert and oriented. No focal neurological deficits. Extremities: Symmetric 5 x 5 power. Skin: No rashes, lesions or ulcers Psychiatry: Judgement and insight appear normal. Mood & affect appropriate.   Data Reviewed: I have personally reviewed following labs and imaging studies  CBC:  Recent Labs Lab 02/19/16 1540 02/20/16 0320 02/22/16 0359 02/23/16 0402  WBC  20.6* 13.4* 8.6 12.5*  NEUTROABS 16.6*  --   --   --   HGB 10.8* 9.5* 9.1* 10.5*  HCT 33.6* 29.8* 30.0* 33.0*  MCV 83.0 82.8 84.7 82.9  PLT 530* 432* 498* [id number]*   Basic Metabolic Panel:  Recent Labs Lab 02/19/16 1540 02/20/16 0320 02/21/16 1235 02/22/16 0359 02/23/16 0402  NA 135 137 137 140 140  K 3.7 3.4* 3.8 4.7 4.0  CL 100* 101 101 102 101  CO2 25 27 27 30 26   GLUCOSE 101* 100* 110* 91 105*  BUN 18 10 15 10 10   CREATININE 0.78 0.65 0.64 0.69 0.74  CALCIUM 9.0 8.8* 8.7* 9.0 9.3   Coagulation Profile:  Recent Labs Lab 02/20/16 0320  INR 1.23   Urine analysis:    Component Value Date/Time   COLORURINE AMBER (A) 02/19/2016 1636   APPEARANCEUR TURBID (A) 02/19/2016 1636   LABSPEC 1.025 02/19/2016 1636   PHURINE 5.0 02/19/2016 1636   GLUCOSEU NEGATIVE 02/19/2016 1636   HGBUR NEGATIVE 02/19/2016 1636   BILIRUBINUR NEGATIVE 02/19/2016 1636     KETONESUR NEGATIVE 02/19/2016 1636   PROTEINUR NEGATIVE 02/19/2016 1636   NITRITE NEGATIVE 02/19/2016 1636   LEUKOCYTESUR NEGATIVE 02/19/2016 1636   Radiology Studies: Ct Abdomen Pelvis W Contrast  Result Date: 02/22/2016 CLINICAL DATA:  Followup diverticular abscess. Status post percutaneous catheter drainage. EXAM: CT ABDOMEN AND PELVIS WITH CONTRAST TECHNIQUE: Multidetector CT imaging of the abdomen and pelvis was performed using the standard protocol following bolus administration of intravenous contrast. CONTRAST:  168mL ISOVUE-300 IOPAMIDOL (ISOVUE-300) INJECTION 61% COMPARISON:  02/20/2016 FINDINGS: Lower Chest: No acute findings. Hepatobiliary:  No masses identified. Gallbladder is unremarkable. Pancreas:  No mass or inflammatory changes. Spleen: Within normal limits in size and appearance. Adrenals/Urinary Tract: No masses identified. Small fluid attenuation left renal cyst noted. No evidence of hydronephrosis. Stomach/Bowel: Previous partial left colectomy with transverse colostomy again seen. Left abdominal percutaneous drainage catheter is seen in place. Adjacent inflammatory changes are seen, however the previously seen fluid collection at this location is nearly completely resolved. A small residual collection is seen superior to the catheter which measures approximately 2.3 x 1.8 by 5.3 cm. No new fluid collections identified within abdomen or pelvis. Vascular/Lymphatic: No pathologically enlarged lymph nodes. No abdominal aortic aneurysm. Reproductive:  No mass identified. Other:  None. Musculoskeletal:  No suspicious bone lesions identified. IMPRESSION: Near complete resolution of left abdominal abscess following percutaneous catheter drainage. Electronically Signed   By: [name] [name] M.D.   On: 02/22/2016 13:16   Scheduled Meds: . atorvastatin  10 mg Oral QHS  . [START ON 02/24/2016] DAPTOmycin (CUBICIN)  IV  500 mg Intravenous Q24H  . losartan  50 mg Oral Daily  .  mometasone-formoterol  2 puff Inhalation BID  . nebivolol  5 mg Oral Daily  . pantoprazole  40 mg Oral Daily  . pramipexole  0.5 mg Oral QHS  . venlafaxine XR  150 mg Oral Q breakfast   Continuous Infusions:    LOS: 4 days   Time spent: 20 minutes   [name] Ramsay, MD [facility] Pager [phone]  If 7PM-7AM, please contact night-coverage [email or web address] Password [email or web address] 02/23/2016, 7:15 PM

## 2016-02-24 LAB — BASIC METABOLIC PANEL
Anion gap: 7 (ref 5–15)
BUN: 12 mg/dL (ref 6–20)
CALCIUM: 9.3 mg/dL (ref 8.9–10.3)
CO2: 30 mmol/L (ref 22–32)
CREATININE: 0.7 mg/dL (ref 0.44–1.00)
Chloride: 105 mmol/L (ref 101–111)
GFR calc non Af Amer: >60 mL/min (ref >60)
Glucose, Bld: 103 mg/dL — ABNORMAL HIGH (ref 65–99)
Potassium: 4.3 mmol/L (ref 3.5–5.1)
SODIUM: 142 mmol/L (ref 135–145)

## 2016-02-24 LAB — CBC
HCT: 31.1 % — ABNORMAL LOW (ref 36.0–46.0)
Hemoglobin: 9.5 g/dL — ABNORMAL LOW (ref 12.0–15.0)
MCH: 25.9 pg — ABNORMAL LOW (ref 26.0–34.0)
MCHC: 30.5 g/dL (ref 30.0–36.0)
MCV: 84.7 fL (ref 78.0–100.0)
PLATELETS: 469 10*3/uL — AB (ref 150–400)
RBC: 3.67 MIL/uL — ABNORMAL LOW (ref 3.87–5.11)
RDW: 15.1 % (ref 11.5–15.5)
WBC: 6 10*3/uL (ref 4.0–10.5)

## 2016-02-24 MED ORDER — SULFAMETHOXAZOLE-TRIMETHOPRIM 800-160 MG PO TABS
1.0000 | ORAL_TABLET | Freq: Three times a day (TID) | ORAL | Status: DC
  Administered 2016-02-24 – 2016-02-25 (×4): 1 via ORAL
  Filled 2016-02-24 (×4): qty 1

## 2016-02-24 NOTE — Progress Notes (Signed)
Patient ID: [name] [name], female   DOB: 1960/12/04, 56 y.o.   MRN: [id number]    PROGRESS NOTE    [name] [name]  [id number] DOB: 02-28-1960 DOA: 02/19/2016  PCP: Moshe Cipro, MD   Brief Narrative:  56 y.o. 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  Started running a fever over the weekend.  Went to PCP and they sent her to ED for evaluation.   Assessment & Plan:  Sepsis secondary to Abdominal abscess, preliminary culture with MRSA - Patient with a remote h/o perforated diverticulitis which eventually led to colostomy - Found to have a large 12 cm left paracolic gutter abscess - IR consulted for drainage - pt is now s/p CT drain in LLQ for abd abscess, appreciate IR team assistance  - vancomycin was added to zosyn but cultures now with MRSA so since pt can not tolerate Vancomycin, ID doctor approved daptomycin  - WBC is trending down form 20 K --> 13 K --> 8.6 --> 12 K --6K - per ID team, change ABX to Bactrim DS TID for duration of the drain placement and then one week longer after drain removed  - today is day #2 of efficient ABX treatment   Anemia of acute illness  - Hgb 10.8 (11.3 on 02/09/16, 12.1 on 01/24/16) - currently no signs of active bleeding  - CBC in AM  Hypokalemia - supplemented and WNL this AM  OSA - on CPAP  HTN, essential  - Continue Cozaar and Bystolic  Morbid obesity  - Body mass index is 51.36 kg/m  DVT prophylaxis: SCD's Code Status: Full  Family Communication: Patient at bedside  Disposition Plan: home in AM if bactrim not causing problems and WBC continue trending down   Consultants:   IR  ID over the phone   Procedures:   CT guided drain placement in LLQ 1/16  Antimicrobials:   Zosyn 1/15 -->  Subjective: Pain much improved, no N/V, less abd pain.  Objective: Vitals:   02/23/16 1500 02/23/16  2130 02/24/16 0640 02/24/16 1400  BP:  131/68 98/68 94/63   Pulse: 77 76 80 74  Resp: 18     Temp: 99.1 F (37.3 C) 97 F (36.1 C) 97.6 F (36.4 C) 98.1 F (36.7 C)  TempSrc: Oral Oral Oral Oral  SpO2: 94% 95% 94% 94%  Weight:      Height:        Intake/Output Summary (Last 24 hours) at 02/24/16 1908 Last data filed at 02/24/16 1700  Gross per 24 hour  Intake              840 ml  Output                0 ml  Net              840 ml   Filed Weights   02/19/16 1434  Weight: 119.3 kg (263 lb)    Examination:  General exam: Appears calm and comfortable  Respiratory system: Clear to auscultation. Respiratory effort normal. Cardiovascular system: S1 & S2 heard, RRR. No JVD, murmurs, rubs, gallops or clicks. No pedal edema. Gastrointestinal system: Abdomen is nondistended, mildly tender in LLQ Central nervous system: Alert and oriented. No focal neurological deficits. Extremities: Symmetric 5 x 5 power. Skin: No rashes, lesions or ulcers Psychiatry: Judgement and insight appear normal. Mood & affect appropriate.   Data Reviewed:  I have personally reviewed following labs and imaging studies  CBC:  Recent Labs Lab 02/19/16 1540 02/20/16 0320 02/22/16 0359 02/23/16 0402 02/24/16 0427  WBC 20.6* 13.4* 8.6 12.5* 6.0  NEUTROABS 16.6*  --   --   --   --   HGB 10.8* 9.5* 9.1* 10.5* 9.5*  HCT 33.6* 29.8* 30.0* 33.0* 31.1*  MCV 83.0 82.8 84.7 82.9 84.7  PLT 530* 432* 498* 532* [id number]*   Basic Metabolic Panel:  Recent Labs Lab 02/20/16 0320 02/21/16 1235 02/22/16 0359 02/23/16 0402 02/24/16 0427  NA 137 137 140 140 142  K 3.4* 3.8 4.7 4.0 4.3  CL 101 101 102 101 105  CO2 27 27 30 26 30   GLUCOSE 100* 110* 91 105* 103*  BUN 10 15 10 10 12   CREATININE 0.65 0.64 0.69 0.74 0.70  CALCIUM 8.8* 8.7* 9.0 9.3 9.3   Coagulation Profile:  Recent Labs Lab 02/20/16 0320  INR 1.23   Urine analysis:    Component Value Date/Time   COLORURINE AMBER (A) 02/19/2016 1636    APPEARANCEUR TURBID (A) 02/19/2016 1636   LABSPEC 1.025 02/19/2016 1636   PHURINE 5.0 02/19/2016 1636   GLUCOSEU NEGATIVE 02/19/2016 1636   HGBUR NEGATIVE 02/19/2016 1636   BILIRUBINUR NEGATIVE 02/19/2016 1636   KETONESUR NEGATIVE 02/19/2016 1636   PROTEINUR NEGATIVE 02/19/2016 1636   NITRITE NEGATIVE 02/19/2016 1636   LEUKOCYTESUR NEGATIVE 02/19/2016 1636   Radiology Studies: No results found. Scheduled Meds: . atorvastatin  10 mg Oral QHS  . losartan  25 mg Oral Daily  . mometasone-formoterol  2 puff Inhalation BID  . nebivolol  5 mg Oral Daily  . pantoprazole  40 mg Oral Daily  . pramipexole  0.5 mg Oral QHS  . sulfamethoxazole-trimethoprim  1 tablet Oral TID  . venlafaxine XR  150 mg Oral Q breakfast   Continuous Infusions:    LOS: 5 days   Time spent: 20 minutes   Faye Ramsay, MD [facility] Pager [phone]  If 7PM-7AM, please contact night-coverage [email or web address] Password [email or web address] 02/24/2016, 7:08 PM

## 2016-02-25 ENCOUNTER — Encounter (HOSPITAL_COMMUNITY): Payer: BLUE CROSS/BLUE SHIELD

## 2016-02-25 ENCOUNTER — Other Ambulatory Visit: Payer: Self-pay | Admitting: Physician Assistant

## 2016-02-25 DIAGNOSIS — K651 Peritoneal abscess: Secondary | ICD-10-CM

## 2016-02-25 LAB — AEROBIC/ANAEROBIC CULTURE (SURGICAL/DEEP WOUND)

## 2016-02-25 LAB — CBC
HEMATOCRIT: 32.2 % — AB (ref 36.0–46.0)
Hemoglobin: 9.8 g/dL — ABNORMAL LOW (ref 12.0–15.0)
MCH: 25.9 pg — AB (ref 26.0–34.0)
MCHC: 30.4 g/dL (ref 30.0–36.0)
MCV: 85 fL (ref 78.0–100.0)
Platelets: 520 10*3/uL — ABNORMAL HIGH (ref 150–400)
RBC: 3.79 MIL/uL — ABNORMAL LOW (ref 3.87–5.11)
RDW: 15.3 % (ref 11.5–15.5)
WBC: 5.1 10*3/uL (ref 4.0–10.5)

## 2016-02-25 LAB — BASIC METABOLIC PANEL
Anion gap: 8 (ref 5–15)
BUN: 14 mg/dL (ref 6–20)
CO2: 27 mmol/L (ref 22–32)
CREATININE: 0.78 mg/dL (ref 0.44–1.00)
Calcium: 9.1 mg/dL (ref 8.9–10.3)
Chloride: 105 mmol/L (ref 101–111)
GFR calc non Af Amer: >60 mL/min (ref >60)
Glucose, Bld: 76 mg/dL (ref 65–99)
Potassium: 3.9 mmol/L (ref 3.5–5.1)
Sodium: 140 mmol/L (ref 135–145)

## 2016-02-25 LAB — AEROBIC/ANAEROBIC CULTURE W GRAM STAIN (SURGICAL/DEEP WOUND): Special Requests: NORMAL

## 2016-02-25 MED ORDER — OXYCODONE-ACETAMINOPHEN 5-325 MG PO TABS: 1.0000 | ORAL_TABLET | Freq: Four times a day (QID) | ORAL | 0 refills | Status: DC | PRN

## 2016-02-25 MED ORDER — SULFAMETHOXAZOLE-TRIMETHOPRIM 800-160 MG PO TABS: 1.0000 | ORAL_TABLET | Freq: Three times a day (TID) | ORAL | 1 refills | Status: DC

## 2016-02-25 MED ORDER — ONDANSETRON HCL 4 MG PO TABS: 4.0000 mg | ORAL_TABLET | Freq: Four times a day (QID) | ORAL | 0 refills | Status: DC | PRN

## 2016-02-25 NOTE — Discharge Instructions (Signed)
Percutaneous Abscess Drain An abscess is a collection of infected fluid inside the body. Your health care provider may decide to remove or drain the infected fluid from the area by placing a thin needle into the abscess. Usually, a small tube is left in place to drain the abscess fluid. The abscess fluid may take a few days to drain. LET [facility] CARE PROVIDER KNOW ABOUT:  Any allergies you have.  All medicines you are taking, including vitamins, herbs, eye drops, creams, and over-the-counter medicines. This includes steroid medicines by mouth or cream.  Previous problems you or members of your family have had with the use of anesthetics.  Any blood disorders you have.  Previous surgeries you have had.  Possibility of pregnancy, if this applies.  Medical conditions you have.  Any history of smoking. RISKS AND COMPLICATIONS Generally, this is a safe procedure. However, problems can occur and include:   Infection.  Allergic reaction to materials used (such as contrast dye).  Damage to a nearby organ or tissue.  Bleeding.  Blockage of a tube placed to drain the abscess, requiring placement of a new drainage tube.  A need to repeat the procedure.  Failure of the procedure to adequately drain the abscess, requiring an open surgical procedure to do so. BEFORE THE PROCEDURE   Ask your health care provider about:  Changing or stopping your regular medicines. This is especially important if you are taking diabetes medicines or blood thinners.  Taking medicines such as aspirin and ibuprofen. These medicines can thin your blood. Do not take these medicines before your procedure if your health care provider asks you not to.  Your health care provider may do some blood or urine tests. These will help your health care provider learn how well your kidneys and liver are working and how well your blood clots.  Do not eat or drink anything after midnight on the night before the  procedure or as directed by your health care provider.  Make arrangements for someone to drive you home after the procedure.  PROCEDURE   An IV tube will be placed in your arm. Medicine will be able to flow directly into your body through this tube.  You will lie on an X-ray table.  Your heart rate, blood pressure, and breathing will be monitored.   Your oxygen level will also be watched during the procedure. Supplemental oxygen may be given if necessary.  The skin around the area where the drainage tube (catheter) will be placed will be cleaned and numbed.  A small cut (incision) will then be made to insert the drainage tube. The drainage tube will be inserted using X-ray or CT scan to help direct where it should be placed.  The drainage tube will be guided into the abscess to drain the infected fluid.  The drainage tube may stay in place and be connected to a bag outside your body. It will stay until the fluid has stopped draining and the infection is gone. AFTER THE PROCEDURE  You will be taken to a recovery area where you will stay until the medicines have worn off.  You will stay in bed for several hours.  Your progress will be monitored.   Your blood pressure and pulse will be checked often.   The area of the incision will be checked often.  You may have some pain or feel sick. Tell your health care provider.  As you begin to feel better, you may be given ice,  fluids, and food.   When you can walk, drink, eat, and use the bathroom, you may be able to go home. This information is not intended to replace advice given to you by your health care provider. Make sure you discuss any questions you have with your health care provider. Document Released: 06/07/2013 Document Reviewed: 06/07/2013 Elsevier Interactive Patient Education  2017 [employer].

## 2016-02-25 NOTE — Discharge Summary (Signed)
Physician Discharge Summary  [name] [name] [id number] DOB: 01-20-1961 DOA: 02/19/2016  PCP: Moshe Cipro, MD  Admit date: 02/19/2016 Discharge date: 02/25/2016  Recommendations for Outpatient Follow-up:  1. Pt will need to follow up with PCP in 1-2 weeks post discharge 2. Please obtain BMP to evaluate electrolytes and kidney function 3. Please also check CBC to evaluate Hg and Hct levels 4. Pt will have an appointment scheduled at the IR clinic for drain follow up   5. Please note that ID team recommended discharge on Bactrim at least while pt has drain and than to continue one week longer after drain removed  ADDENDUM: pt called my cell phone and reported concerning allergic reaction to Bactrim, I have called her pharmacy and changed ABX to doxycycline per ID team recommendations (Dr. 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 Startedrunning a fever over the weekend. Went to PCP and they sent her to ED for evaluation.   Assessment & Plan: Sepsis secondary to Abdominal abscess, preliminary culture with MRSA - Patient with a remote h/o perforated diverticulitis which eventually led to colostomy - Found to have a large 12 cm left paracolic gutter abscess - IR consulted for drainage - pt is now s/p CT drain in LLQ for abd abscess, appreciate IR team assistance  - vancomycin was added to zosyn but cultures now with MRSA so since pt can not tolerate Vancomycin, ID doctor approved daptomycin  - WBC is trending down form 20 K --> 13 K --> 8.6 --> 12 K --> 6K --> 5 K - per ID  team, change ABX to Bactrim but due to reported allergic reaction, abx changed to doxy  Anemia of acute illness  - Hgb 10.8 (11.3 on 02/09/16, 12.1 on 01/24/16) - currently no signs of active bleeding   Hypokalemia - supplemented and WNL this AM  OSA - on CPAP  HTN, essential  - Continue home medical regimen   Morbid obesity  - Body mass index is 51.36 kg/m  DVT prophylaxis: SCD's Code Status: Full  Family Communication: Patient at bedside  Disposition Plan: home   Consultants:   IR  ID over the phone   Procedures:   CT guided drain placement in LLQ 1/16  Procedures/Studies: Ct Abdomen Pelvis W Contrast  Result Date: 02/22/2016 CLINICAL DATA:  Followup diverticular abscess. Status post percutaneous catheter drainage. EXAM: CT ABDOMEN AND PELVIS WITH CONTRAST TECHNIQUE: Multidetector CT imaging of the abdomen and pelvis was performed using the standard protocol following bolus administration of intravenous contrast. CONTRAST:  153mL ISOVUE-300 IOPAMIDOL (ISOVUE-300) INJECTION 61% COMPARISON:  02/20/2016 FINDINGS: Lower Chest: No acute findings. Hepatobiliary:  No masses identified. Gallbladder is unremarkable. Pancreas:  No mass or inflammatory changes. Spleen: Within normal limits in size and appearance. Adrenals/Urinary Tract: No masses identified. Small fluid attenuation left renal cyst noted. No evidence of hydronephrosis. Stomach/Bowel: Previous partial left colectomy with transverse colostomy again seen. Left abdominal percutaneous drainage catheter is seen in place. Adjacent inflammatory changes are seen, however the previously seen fluid collection at this location is nearly completely resolved. A small residual collection is seen superior to the catheter which measures  approximately 2.3 x 1.8 by 5.3 cm. No new fluid collections identified within abdomen or pelvis. Vascular/Lymphatic: No pathologically enlarged lymph nodes. No abdominal aortic aneurysm.  Reproductive:  No mass identified. Other:  None. Musculoskeletal:  No suspicious bone lesions identified. IMPRESSION: Near complete resolution of left abdominal abscess following percutaneous catheter drainage. Electronically Signed   By: Earle Gell M.D.   On: 02/22/2016 13:16   Ct Abdomen Pelvis W Contrast  Result Date: 02/19/2016 CLINICAL DATA:  Left abdominal pain for several months, worsening last 2-3 days, elevated white blood cell count, history of perforated colon and diverticulitis. EXAM: CT ABDOMEN AND PELVIS WITH CONTRAST TECHNIQUE: Multidetector CT imaging of the abdomen and pelvis was performed using the standard protocol following bolus administration of intravenous contrast. CONTRAST:  172mL ISOVUE-300 IOPAMIDOL (ISOVUE-300) INJECTION 61% COMPARISON:  None. FINDINGS: Lower chest: No acute abnormality. Hepatobiliary: No focal liver abnormality is seen. Probable punctate stone within the gallbladder. No gallbladder wall thickening or pericholecystic fluid. No bile duct dilatation seen. Pancreas: Unremarkable. No pancreatic ductal dilatation or surrounding inflammatory changes. Spleen: Normal in size without focal abnormality. Adrenals/Urinary Tract: Adrenal glands appear normal. Benign appearing cyst within the lateral cortex of the left kidney measures 2.6 x 2.5 cm. Kidneys otherwise unremarkable bilaterally without stone or hydronephrosis. No ureteral or bladder calculi identified. Bladder is unremarkable, decompressed. 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 No dilated large or small bowel loops. Appendix is normal. Vascular/Lymphatic: Scattered mild atherosclerotic changes of the normal caliber abdominal aorta. No enlarged lymph nodes appreciated in the abdomen or pelvis. Reproductive: Uterus and bilateral adnexa are unremarkable. Other: There is a complex intra-abdominal fluid collection within the left paracolic gutter,  measuring approximately 12 cm craniocaudal dimension and 2.5 cm greatest thickness, with surrounding inflammatory fluid stranding. No free intraperitoneal air appreciated. Musculoskeletal: Ill-defined edema within the subcutaneous soft tissues of the left lateral abdominal wall, possibly reactive to and/or contiguous with the fluid collection in the underlying paracolic gutter. No acute or suspicious osseous finding. IMPRESSION: 1. Complex intra-abdominal fluid collection within the left paracolic gutter, with a walled-off appearance, measuring approximately 12 cm craniocaudal dimension and 2.5 cm greatest thickness, with surrounding inflammatory fluid stranding, presumed abscess collection. No obvious source for the presumed abscess. No air is seen within the collection to confirm abscess or to suggest fistulous connection with adjacent bowel. 2. Ill-defined edema/fluid stranding within the subcutaneous soft tissues of the left lateral abdominal wall, and thickening/edema of the intervening abdominal wall musculature, almost certainly reactive to and/or contiguous with the presumed abscess collection in the underlying paracolic gutter. No circumscribed fluid collection is seen within these superficial soft tissues. 3. Surgical changes of previous partial colectomy with left anterior abdominal wall colostomy creation. No evidence of obstruction or other surgical complicating feature at the colostomy site. 4. Aortic atherosclerosis. Probable punctate gallstone within the otherwise normal-appearing gallbladder. Additional chronic/incidental findings detailed above. These results were called by telephone at the time of interpretation on 02/19/2016 at 5:59 pm to Dr. Davonna Belling , who verbally acknowledged these results. Electronically Signed   By: Franki Cabot M.D.   On: 02/19/2016 18:06   Ct Image Guided Drainage By Percutaneous Catheter  Result Date: 02/20/2016 INDICATION: Diverticular abscess, left lower  quadrant EXAM: CT GUIDED DRAINAGE OF DIVERTICULAR ABSCESS MEDICATIONS: The patient is currently admitted to the hospital and receiving intravenous antibiotics. The antibiotics were administered within an appropriate time frame prior to the initiation of the procedure. ANESTHESIA/SEDATION:  2.0 mg IV Versed 100 mcg IV Fentanyl Moderate Sedation Time:  15 MINUTES The patient was continuously monitored during the procedure by the interventional radiology nurse under my direct supervision. COMPLICATIONS: None immediate. TECHNIQUE: Informed written consent was obtained from the patient after a thorough discussion of the procedural risks, benefits and alternatives. All questions were addressed. Maximal Sterile Barrier Technique was utilized including caps, mask, sterile gowns, sterile gloves, sterile drape, hand hygiene and skin antiseptic. A timeout was performed prior to the initiation of the procedure. PROCEDURE: previous imaging reviewed. patient positioned supine. noncontrast localization ct performed. the left lower quadrant fluid collection along the pericolic gutter was localized. overlying skin marked. The LEFT LOWER QUADRANT was prepped with Chlorhexidine in a sterile fashion, and a sterile drape was applied covering the operative field. A sterile gown and sterile gloves were used for the procedure. Local anesthesia was provided with 1% Lidocaine. Under sterile conditions and local anesthesia, an 18 gauge 10 cm access needle was advanced percutaneously from an anterior oblique approach into the left lower quadrant diverticular abscess. Needle position confirmed with CT. Syringe aspiration yielded purulent fluid. Sample sent for Gram stain culture. Guidewire inserted followed by tract dilatation to insert a 10 [place] drain. Drain catheter position confirmed with CT. Syringe aspiration yielded 30 cc purulent fluid. Catheter secured with a Prolene suture and a sterile dressing. External suction bulb connected. No  immediate complication. Patient tolerated the procedure well. FINDINGS: CT imaging confirms percutaneous needle access for drain insertion into the left lower quadrant diverticular abscess IMPRESSION: Successful left lower quadrant CT diverticular abscess drain Electronically Signed   By: Jerilynn Mages.  Shick M.D.   On: 02/20/2016 15:14    Discharge Exam: Vitals:   02/24/16 2132 02/25/16 0558  BP: 112/78 111/74  Pulse: 87 60  Resp:  18  Temp: 97.7 F (36.5 C) 98.4 F (36.9 C)   Vitals:   02/24/16 1400 02/24/16 2107 02/24/16 2132 02/25/16 0558  BP: 94/63  112/78 111/74  Pulse: 74  87 60  Resp:    18  Temp: 98.1 F (36.7 C)  97.7 F (36.5 C) 98.4 F (36.9 C)  TempSrc: Oral  Oral Oral  SpO2: 94% 95% 96%   Weight:      Height:        General: Pt is alert, follows commands appropriately, not in acute distress Cardiovascular: Regular rate and rhythm, S1/S2 +, no murmurs, no rubs, no gallops Respiratory: Clear to auscultation bilaterally, no wheezing, no crackles, no rhonchi Abdominal: Soft, non tender, non distended, bowel sounds +, no guarding  Discharge Instructions  Discharge Instructions    Diet - low sodium heart healthy    Complete by:  As directed    Increase activity slowly    Complete by:  As directed      Allergies as of 02/25/2016      Reactions   Penicillins Shortness Of Breath   Tolerates Zosyn   Cortisone Hives   Crestor [rosuvastatin Calcium] Other (See Comments)   "started messing up my liver"   Dilaudid [hydromorphone Hcl] Other (See Comments)   Respiratory depression   Neosporin [neomycin-bacitracin Zn-polymyx] Hives   Vancomycin Nausea And Vomiting   And Red Man's Syndrome      Medication List    TAKE these medications   albuterol 108 (90 Base) MCG/ACT inhaler Commonly known as:  PROVENTIL HFA;VENTOLIN HFA Inhale into the lungs every 6 (six) hours as needed for wheezing or shortness of breath.   atorvastatin 10 MG  tablet Commonly known as:   LIPITOR Take 10 mg by mouth at bedtime.   Calcium Carbonate-Vitamin D 600-400 MG-UNIT tablet Take 1 tablet by mouth daily.   Fluticasone-Salmeterol 250-50 MCG/DOSE Aepb Commonly known as:  ADVAIR Inhale 1 puff into the lungs 2 (two) times daily.   losartan 50 MG tablet Commonly known as:  COZAAR Take 50 mg by mouth daily.   nebivolol 5 MG tablet Commonly known as:  BYSTOLIC Take 5 mg by mouth daily.   ondansetron 4 MG tablet Commonly known as:  ZOFRAN Take 1 tablet (4 mg total) by mouth every 6 (six) hours as needed for nausea.   oxyCODONE-acetaminophen 5-325 MG tablet Commonly known as:  PERCOCET/ROXICET Take 1 tablet by mouth every 6 (six) hours as needed for moderate pain.   pantoprazole 40 MG tablet Commonly known as:  PROTONIX Take 40 mg by mouth daily.   polyethylene glycol powder powder Commonly known as:  GLYCOLAX/MIRALAX MIX 1 HEAPING TABLESPOONFUL IN 8 OUNCES OF A LIQUID AND DRINK BID PRF CONSTIPATION   pramipexole 0.5 MG tablet Commonly known as:  MIRAPEX Take 0.25 mg by mouth at bedtime.   pramipexole 0.5 MG tablet Commonly known as:  MIRAPEX Take 0.5 mg by mouth at bedtime.   Doxycycline 100 mg BID PO for the time while drain is in place and then one week longer after drain removed    venlafaxine XR 150 MG 24 hr capsule Commonly known as:  EFFEXOR-XR Take 150 mg by mouth daily with breakfast.      Follow-up Information    CAPLAN,MICHAEL, MD Follow up.   Specialty:  Internal Medicine Contact information: [address] [place] [address] [phone]        Faye Ramsay, MD Follow up.   Specialty:  Internal Medicine Why:  call myc ell phone [phone] wiht questions  Contact information: [address] [place] [address] [phone]            The results of significant diagnostics from this hospitalization (including imaging, microbiology, ancillary and laboratory) are listed below for  reference.     Microbiology: Recent Results (from the past 240 hour(s))  Aerobic/Anaerobic Culture (surgical/deep wound)     Status: None (Preliminary result)   Collection Time: 02/20/16  1:51 PM  Result Value Ref Range Status   Specimen Description ABDOMEN  Final   Special Requests Normal  Final   Gram Stain   Final    ABUNDANT WBC PRESENT, PREDOMINANTLY PMN ABUNDANT GRAM POSITIVE COCCI IN CLUSTERS    Culture   Final    ABUNDANT METHICILLIN RESISTANT STAPHYLOCOCCUS AUREUS NO ANAEROBES ISOLATED; CULTURE IN PROGRESS FOR 5 DAYS    Report Status PENDING  Incomplete   Organism ID, Bacteria METHICILLIN RESISTANT STAPHYLOCOCCUS AUREUS  Final      Susceptibility   Methicillin resistant staphylococcus aureus - MIC*    CIPROFLOXACIN >=8 RESISTANT Resistant     ERYTHROMYCIN <=0.25 SENSITIVE Sensitive     GENTAMICIN <=0.5 SENSITIVE Sensitive     OXACILLIN >=4 RESISTANT Resistant     TETRACYCLINE <=1 SENSITIVE Sensitive     VANCOMYCIN <=0.5 SENSITIVE Sensitive     TRIMETH/SULFA <=10 SENSITIVE Sensitive     CLINDAMYCIN <=0.25 SENSITIVE Sensitive     RIFAMPIN <=0.5 SENSITIVE Sensitive     Inducible Clindamycin NEGATIVE Sensitive     * ABUNDANT METHICILLIN RESISTANT STAPHYLOCOCCUS AUREUS  Culture, blood (routine x 2)     Status: None (Preliminary result)   Collection Time:  02/23/16  4:02 AM  Result Value Ref Range Status   Specimen Description BLOOD LEFT ARM  Final   Special Requests BOTTLES DRAWN AEROBIC AND ANAEROBIC 5ML  Final   Culture NO GROWTH 1 DAY  Final   Report Status PENDING  Incomplete  Culture, blood (routine x 2)     Status: None (Preliminary result)   Collection Time: 02/23/16  4:10 AM  Result Value Ref Range Status   Specimen Description BLOOD RIGHT HAND  Final   Special Requests BOTTLES DRAWN AEROBIC AND ANAEROBIC 5ML  Final   Culture NO GROWTH 1 DAY  Final   Report Status PENDING  Incomplete     Labs: Basic Metabolic Panel:  Recent Labs Lab 02/21/16 1235  02/22/16 0359 02/23/16 0402 02/24/16 0427 02/25/16 0538  NA 137 140 140 142 140  K 3.8 4.7 4.0 4.3 3.9  CL 101 102 101 105 105  CO2 27 30 26 30 27   GLUCOSE 110* 91 105* 103* 76  BUN 15 10 10 12 14   CREATININE 0.64 0.69 0.74 0.70 0.78  CALCIUM 8.7* 9.0 9.3 9.3 9.1   CBC:  Recent Labs Lab 02/19/16 1540 02/20/16 0320 02/22/16 0359 02/23/16 0402 02/24/16 0427 02/25/16 0538  WBC 20.6* 13.4* 8.6 12.5* 6.0 5.1  NEUTROABS 16.6*  --   --   --   --   --   HGB 10.8* 9.5* 9.1* 10.5* 9.5* 9.8*  HCT 33.6* 29.8* 30.0* 33.0* 31.1* 32.2*  MCV 83.0 82.8 84.7 82.9 84.7 85.0  PLT 530* 432* 498* 532* 469* 520*   Cardiac Enzymes:  Recent Labs Lab 02/23/16 1223  CKTOTAL 20*   CBG:  Recent Labs Lab 02/22/16 [id number]  GLUCAP 81    SIGNED: Time coordinating discharge: 30 minutes  Faye Ramsay, MD  [facility] 02/25/2016, 11:06 AM Pager [phone]  If 7PM-7AM, please contact night-coverage [email or web address] Password [email or web address]

## 2016-02-28 LAB — CULTURE, BLOOD (ROUTINE X 2)
CULTURE: NO GROWTH
Culture: NO GROWTH

## 2016-02-28 NOTE — Care Management (Signed)
Case manager contacted Retta Diones with [facility] in [place] at [phone] referral. They will be able to service patient. CM faxed demographics, H&P, OP note and orders to her at [phone]. Call will be placed to patient. Ricki Miller, RN BSN Case Manager

## 2016-02-28 NOTE — Care Management (Addendum)
Patient lives in [place]. CM will contact [place] agency [facility] that covers [facility] for assistance staffing.

## 2016-02-29 ENCOUNTER — Other Ambulatory Visit: Payer: Self-pay | Admitting: Internal Medicine

## 2016-02-29 DIAGNOSIS — K651 Peritoneal abscess: Secondary | ICD-10-CM

## 2016-03-01 ENCOUNTER — Encounter: Payer: Self-pay | Admitting: Gastroenterology

## 2016-03-06 ENCOUNTER — Other Ambulatory Visit: Payer: BLUE CROSS/BLUE SHIELD

## 2016-03-06 ENCOUNTER — Ambulatory Visit: Payer: BLUE CROSS/BLUE SHIELD | Admitting: Internal Medicine

## 2016-03-07 ENCOUNTER — Ambulatory Visit
Admission: RE | Admit: 2016-03-07 | Discharge: 2016-03-07 | Disposition: A | Discharge status: Home / Self Care | Payer: BLUE CROSS/BLUE SHIELD | Source: Ambulatory Visit | Attending: Physician Assistant | Admitting: Physician Assistant

## 2016-03-07 ENCOUNTER — Ambulatory Visit
Admission: RE | Admit: 2016-03-07 | Discharge: 2016-03-07 | Disposition: A | Discharge status: Home / Self Care | Payer: BLUE CROSS/BLUE SHIELD | Source: Ambulatory Visit | Attending: Internal Medicine | Admitting: Internal Medicine

## 2016-03-07 DIAGNOSIS — K651 Peritoneal abscess: Secondary | ICD-10-CM

## 2016-03-07 HISTORY — PX: IR GENERIC HISTORICAL: IMG1180011

## 2016-03-07 MED ORDER — IOPAMIDOL (ISOVUE-300) INJECTION 61%
125.0000 mL | Freq: Once | INTRAVENOUS | Status: AC | PRN
  Administered 2016-03-07: 125 mL via INTRAVENOUS

## 2016-03-07 NOTE — Progress Notes (Signed)
Chief Complaint: LLQ drain  Referring Physician(s): Karmen Bongo  Supervising Physician: Markus Daft  History of Present Illness: [name] [name] is a 56 y.o. female  with medical history significant of perforated diverticulitis in 3/16 resulting in colostomy.  She also has OSA on CPAP, and HTN.  She presented tot he ED on 02/19/2016 with abdominal pain for the last few months.   She had startedrunning a fever over the previous weekend.   Pain was in LLQ and radiates around to the back.   At that time her fever was as high as 101.7.  CT scan revealed left paracolic fluid collection.  She underwent CT guided drain placement by Dr. Annamaria Boots on 02/19/2016.  She is seen in drain clinic today for CT scan and injection.  She denies any recent fever. She reports only about 5 mL of output which includes a 10 mL of flush per day.   Past Medical History:  Diagnosis Date  . Anemia   . Arthritis    "shoulders, ankles, back" (02/23/2016)  . Asthma   . COPD (chronic obstructive pulmonary disease) ([place])   . Depression   . Diarrhea age 55  . Diverticulitis SEP 2015   PERFORATED, SIGMOID COLON  . GERD (gastroesophageal reflux disease)   . Heart murmur   . History of hiatal hernia   . HTN (hypertension)   . Hyperlipidemia   . Kidney disease    "had kidney disease when I was a child"  . OSA on CPAP [id number]   nasal; uncertain setting  . Restless legs syndrome     Past Surgical History:  Procedure Laterality Date  . ABSCESS DRAINAGE  ~ 02/20/2016   into my stomach; radiology  . ANTERIOR AND POSTERIOR REPAIR     "w/my bladder tack"  . CERVICAL CONE BIOPSY    . COLONOSCOPY N/A 04/05/2014   [id number] HH/moderate diverticulosis  . 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 ESOPHAGOGASTRODUODENOSCOPY N/A 04/05/2014   [id number] gastric polyps/mild erosive gastritis  . INCONTINENCE SURGERY     bladder tack  . IR GENERIC HISTORICAL  03/07/2016   IR RADIOLOGIST EVAL &  MGMT 03/07/2016 [facility] INTERV RAD  . TUBAL LIGATION      Allergies: Penicillins; Cortisone; Crestor [rosuvastatin calcium]; Dilaudid [hydromorphone hcl]; Neosporin [neomycin-bacitracin zn-polymyx]; and Vancomycin  Medications: Prior to Admission medications   Medication Sig Start Date End Date Taking? Authorizing Provider  albuterol (PROVENTIL HFA;VENTOLIN HFA) 108 (90 BASE) MCG/ACT inhaler Inhale into the lungs every 6 (six) hours as needed for wheezing or shortness of breath.    Historical Provider, MD  atorvastatin (LIPITOR) 10 MG tablet Take 10 mg by mouth at bedtime.     Historical Provider, MD  Calcium Carbonate-Vitamin D 600-400 MG-UNIT tablet Take 1 tablet by mouth daily.     Historical Provider, MD  Fluticasone-Salmeterol (ADVAIR) 250-50 MCG/DOSE AEPB Inhale 1 puff into the lungs 2 (two) times daily.    Historical Provider, MD  losartan (COZAAR) 50 MG tablet Take 50 mg by mouth daily.    Historical Provider, MD  nebivolol (BYSTOLIC) 5 MG tablet Take 5 mg by mouth daily.    Historical Provider, MD  ondansetron (ZOFRAN) 4 MG tablet Take 1 tablet (4 mg total) by mouth every 6 (six) hours as needed for nausea. 02/25/16   Theodis Blaze, MD  oxyCODONE-acetaminophen (PERCOCET/ROXICET) 5-325 MG tablet Take 1 tablet by mouth every 6 (six) hours as needed for moderate pain. 02/25/16   Theodis Blaze,  MD  pantoprazole (PROTONIX) 40 MG tablet Take 40 mg by mouth daily.    Historical Provider, MD  polyethylene glycol powder (GLYCOLAX/MIRALAX) powder MIX 1 HEAPING TABLESPOONFUL IN 8 OUNCES OF A LIQUID AND DRINK BID PRF CONSTIPATION 02/09/16   Historical Provider, MD  pramipexole (MIRAPEX) 0.5 MG tablet Take 0.25 mg by mouth at bedtime.     Historical Provider, MD  pramipexole (MIRAPEX) 0.5 MG tablet Take 0.5 mg by mouth at bedtime.     Historical Provider, MD  sulfamethoxazole-trimethoprim (BACTRIM DS,SEPTRA DS) 800-160 MG tablet Take 1 tablet by mouth 3 (three) times daily. 02/25/16   Theodis Blaze, MD    venlafaxine XR (EFFEXOR-XR) 150 MG 24 hr capsule Take 150 mg by mouth daily with breakfast.    Historical Provider, MD     Family History  Problem Relation Age of Onset  . Diverticulitis Mother   . Diverticulitis Maternal Aunt   . Diverticulitis Maternal Grandmother     Social History   Social History  . Marital status: Married    Spouse name: N/A  . Number of children: N/A  . Years of education: N/A   Occupational History  . previously medical office assistant    Social History Main Topics  . Smoking status: Former Smoker    Packs/day: 2.50    Years: 20.00    Types: Cigarettes    Quit date: 05/21/1997  . Smokeless tobacco: Never Used  . Alcohol use No  . Drug use: No  . Sexual activity: Not Currently   Other Topics Concern  . Not on file   Social History Narrative   Mother passed 5 yrs ago.    Review of Systems  Vital Signs: Pulse 77   Temp 98.5 F (36.9 C) (Oral)   LMP  (LMP Unknown) Comment: menopausal  SpO2 (!) 83%   Physical Exam Awake and Alert NAD Drain in place, intact ~15 mL output per day including what appears to be saline flush Abdomen soft, NTND  Imaging: Ct Abdomen Pelvis W Contrast  Result Date: 03/07/2016 CLINICAL DATA:  56 year old female status post treatment of left lower quadrant abdominal abscess including percutaneous abscess drainage and IV antibiotics. Mild discomfort now at the drain site, reports minimal drainage. Loose stools. Previous diverticulitis and colostomy. Subsequent encounter. EXAM: CT ABDOMEN AND PELVIS WITH CONTRAST TECHNIQUE: Multidetector CT imaging of the abdomen and pelvis was performed using the standard protocol following bolus administration of intravenous contrast. CONTRAST:  135mL ISOVUE-300 IOPAMIDOL (ISOVUE-300) INJECTION 61% COMPARISON:  02/22/2016 CT Abdomen and Pelvis and earlier FINDINGS: Lower chest: Stable and negative.  No pleural effusion. No upper abdominal free air. Hepatobiliary: Mildly decreased  density throughout the liver in keeping with a degree of steatosis. Negative gallbladder. No biliary ductal enlargement. Pancreas: Negative. Spleen: Negative; decreased fat stranding along the inferior tip of the spleen. Adrenals/Urinary Tract: Normal adrenal glands. Stable and negative kidneys and ureters (left renal midpole simple cyst). Decompressed urinary bladder. No abdominal free fluid. Stomach/Bowel: Left of midline epigastric transverse colostomy re - demonstrated. Decompressed rectum. The sigmoid colon appears to be blind-ending in the lower abdomen, inseparable from the distal small bowel mesentery. No dilated large or small bowel loops. Further regression of left flank and left lateral abdominal wall inflammatory stranding and soft tissue thickening. Stable percutaneous pigtail drainage catheter situated subjacent to the left abdominal oblique muscles and abutting several normal appearing small bowel loops. No residual fluid collection is evident. No pneumoperitoneum. Vascular/Lymphatic: Major arterial structures in the abdomen and  pelvis appear normal. The portal venous system is patent. Reproductive: Negative. Other: No pelvic free fluid. Musculoskeletal: No acute osseous abnormality identified. IMPRESSION: 1. Stable percutaneous left abdominal pigtail drain position with further regression of left lateral abdominal wall and peritoneal inflammatory stranding. No residual fluid collection is evident about the pigtail. 2. No new abnormality in the abdomen or pelvis. Transverse colostomy and hepatic steatosis again noted. Electronically Signed   By: Genevie Ann M.D.   On: 03/07/2016 09:13   Ct Abdomen Pelvis W Contrast  Result Date: 02/22/2016 CLINICAL DATA:  Followup diverticular abscess. Status post percutaneous catheter drainage. EXAM: CT ABDOMEN AND PELVIS WITH CONTRAST TECHNIQUE: Multidetector CT imaging of the abdomen and pelvis was performed using the standard protocol following bolus  administration of intravenous contrast. CONTRAST:  166mL ISOVUE-300 IOPAMIDOL (ISOVUE-300) INJECTION 61% COMPARISON:  02/20/2016 FINDINGS: Lower Chest: No acute findings. Hepatobiliary:  No masses identified. Gallbladder is unremarkable. Pancreas:  No mass or inflammatory changes. Spleen: Within normal limits in size and appearance. Adrenals/Urinary Tract: No masses identified. Small fluid attenuation left renal cyst noted. No evidence of hydronephrosis. Stomach/Bowel: Previous partial left colectomy with transverse colostomy again seen. Left abdominal percutaneous drainage catheter is seen in place. Adjacent inflammatory changes are seen, however the previously seen fluid collection at this location is nearly completely resolved. A small residual collection is seen superior to the catheter which measures approximately 2.3 x 1.8 by 5.3 cm. No new fluid collections identified within abdomen or pelvis. Vascular/Lymphatic: No pathologically enlarged lymph nodes. No abdominal aortic aneurysm. Reproductive:  No mass identified. Other:  None. Musculoskeletal:  No suspicious bone lesions identified. IMPRESSION: Near complete resolution of left abdominal abscess following percutaneous catheter drainage. Electronically Signed   By: Earle Gell M.D.   On: 02/22/2016 13:16   Ct Abdomen Pelvis W Contrast  Result Date: 02/19/2016 CLINICAL DATA:  Left abdominal pain for several months, worsening last 2-3 days, elevated white blood cell count, history of perforated colon and diverticulitis. EXAM: CT ABDOMEN AND PELVIS WITH CONTRAST TECHNIQUE: Multidetector CT imaging of the abdomen and pelvis was performed using the standard protocol following bolus administration of intravenous contrast. CONTRAST:  160mL ISOVUE-300 IOPAMIDOL (ISOVUE-300) INJECTION 61% COMPARISON:  None. FINDINGS: Lower chest: No acute abnormality. Hepatobiliary: No focal liver abnormality is seen. Probable punctate stone within the gallbladder. No gallbladder  wall thickening or pericholecystic fluid. No bile duct dilatation seen. Pancreas: Unremarkable. No pancreatic ductal dilatation or surrounding inflammatory changes. Spleen: Normal in size without focal abnormality. Adrenals/Urinary Tract: Adrenal glands appear normal. Benign appearing cyst within the lateral cortex of the left kidney measures 2.6 x 2.5 cm. Kidneys otherwise unremarkable bilaterally without stone or hydronephrosis. No ureteral or bladder calculi identified. Bladder is unremarkable, decompressed. 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 No dilated large or small bowel loops. Appendix is normal. Vascular/Lymphatic: Scattered mild atherosclerotic changes of the normal caliber abdominal aorta. No enlarged lymph nodes appreciated in the abdomen or pelvis. Reproductive: Uterus and bilateral adnexa are unremarkable. Other: There is a complex intra-abdominal fluid collection within the left paracolic gutter, measuring approximately 12 cm craniocaudal dimension and 2.5 cm greatest thickness, with surrounding inflammatory fluid stranding. No free intraperitoneal air appreciated. Musculoskeletal: Ill-defined edema within the subcutaneous soft tissues of the left lateral abdominal wall, possibly reactive to and/or contiguous with the fluid collection in the underlying paracolic gutter. No acute or suspicious osseous finding. IMPRESSION: 1. Complex intra-abdominal fluid collection within the left paracolic gutter, with  a walled-off appearance, measuring approximately 12 cm craniocaudal dimension and 2.5 cm greatest thickness, with surrounding inflammatory fluid stranding, presumed abscess collection. No obvious source for the presumed abscess. No air is seen within the collection to confirm abscess or to suggest fistulous connection with adjacent bowel. 2. Ill-defined edema/fluid stranding within the subcutaneous soft tissues of the left lateral  abdominal wall, and thickening/edema of the intervening abdominal wall musculature, almost certainly reactive to and/or contiguous with the presumed abscess collection in the underlying paracolic gutter. No circumscribed fluid collection is seen within these superficial soft tissues. 3. Surgical changes of previous partial colectomy with left anterior abdominal wall colostomy creation. No evidence of obstruction or other surgical complicating feature at the colostomy site. 4. Aortic atherosclerosis. Probable punctate gallstone within the otherwise normal-appearing gallbladder. Additional chronic/incidental findings detailed above. These results were called by telephone at the time of interpretation on 02/19/2016 at 5:59 pm to Dr. Davonna Belling , who verbally acknowledged these results. Electronically Signed   By: Franki Cabot M.D.   On: 02/19/2016 18:06   Ct Image Guided Drainage By Percutaneous Catheter  Result Date: 02/20/2016 INDICATION: Diverticular abscess, left lower quadrant EXAM: CT GUIDED DRAINAGE OF DIVERTICULAR ABSCESS MEDICATIONS: The patient is currently admitted to the hospital and receiving intravenous antibiotics. The antibiotics were administered within an appropriate time frame prior to the initiation of the procedure. ANESTHESIA/SEDATION: 2.0 mg IV Versed 100 mcg IV Fentanyl Moderate Sedation Time:  15 MINUTES The patient was continuously monitored during the procedure by the interventional radiology nurse under my direct supervision. COMPLICATIONS: None immediate. TECHNIQUE: Informed written consent was obtained from the patient after a thorough discussion of the procedural risks, benefits and alternatives. All questions were addressed. Maximal Sterile Barrier Technique was utilized including caps, mask, sterile gowns, sterile gloves, sterile drape, hand hygiene and skin antiseptic. A timeout was performed prior to the initiation of the procedure. PROCEDURE: previous imaging reviewed.  patient positioned supine. noncontrast localization ct performed. the left lower quadrant fluid collection along the pericolic gutter was localized. overlying skin marked. The LEFT LOWER QUADRANT was prepped with Chlorhexidine in a sterile fashion, and a sterile drape was applied covering the operative field. A sterile gown and sterile gloves were used for the procedure. Local anesthesia was provided with 1% Lidocaine. Under sterile conditions and local anesthesia, an 18 gauge 10 cm access needle was advanced percutaneously from an anterior oblique approach into the left lower quadrant diverticular abscess. Needle position confirmed with CT. Syringe aspiration yielded purulent fluid. Sample sent for Gram stain culture. Guidewire inserted followed by tract dilatation to insert a 10 [place] drain. Drain catheter position confirmed with CT. Syringe aspiration yielded 30 cc purulent fluid. Catheter secured with a Prolene suture and a sterile dressing. External suction bulb connected. No immediate complication. Patient tolerated the procedure well. FINDINGS: CT imaging confirms percutaneous needle access for drain insertion into the left lower quadrant diverticular abscess IMPRESSION: Successful left lower quadrant CT diverticular abscess drain Electronically Signed   By: Jerilynn Mages.  Shick M.D.   On: 02/20/2016 15:14   Ir Radiologist Eval & Mgmt  Result Date: 03/07/2016 Please refer to "Notes" to see consult details.   Labs:  CBC:  Recent Labs  02/22/16 0359 02/23/16 0402 02/24/16 0427 02/25/16 0538  WBC 8.6 12.5* 6.0 5.1  HGB 9.1* 10.5* 9.5* 9.8*  HCT 30.0* 33.0* 31.1* 32.2*  PLT 498* 532* 469* 520*    COAGS:  Recent Labs  02/20/16 0320  INR 1.23  BMP:  Recent Labs  02/22/16 0359 02/23/16 0402 02/24/16 0427 02/25/16 0538  NA 140 140 142 140  K 4.7 4.0 4.3 3.9  CL 102 101 105 105  CO2 30 26 30 27   GLUCOSE 91 105* 103* 76  BUN 10 10 12 14   CALCIUM 9.0 9.3 9.3 9.1  CREATININE 0.69 0.74  0.70 0.78  GFRNONAA >60 >60 >60 >60  GFRAA >60 >60 >60 >60    LIVER FUNCTION TESTS: No results for input(s): BILITOT, AST, ALT, ALKPHOS, PROT, ALBUMIN in the last 8760 hours.  TUMOR MARKERS: No results for input(s): AFPTM, CEA, CA199, CHROMGRNA in the last 8760 hours.  Assessment:  LLQ abscess = s/p drain placed by Dr. Annamaria Boots on 02/19/2016  CT scan done today, reviewed by Dr. Anselm Pancoast, resolution of abscess.  Injection performed today, no apparent fistula  Drain removed   Continue antibiotics x 1 more week as directed.   Electronically Signed: Murrell Redden PA-C 03/07/2016, 9:57 AM   Please refer to Dr. Moises Blood attestation of this note for management and plan.

## 2016-03-25 ENCOUNTER — Ambulatory Visit: Payer: BLUE CROSS/BLUE SHIELD | Admitting: Gastroenterology

## 2016-04-03 ENCOUNTER — Ambulatory Visit (INDEPENDENT_AMBULATORY_CARE_PROVIDER_SITE_OTHER): Payer: BLUE CROSS/BLUE SHIELD | Admitting: Gastroenterology

## 2016-04-03 ENCOUNTER — Encounter: Payer: Self-pay | Admitting: Gastroenterology

## 2016-04-03 ENCOUNTER — Other Ambulatory Visit: Payer: Self-pay

## 2016-04-03 DIAGNOSIS — K651 Peritoneal abscess: Secondary | ICD-10-CM | POA: Diagnosis not present

## 2016-04-03 DIAGNOSIS — K5732 Diverticulitis of large intestine without perforation or abscess without bleeding: Secondary | ICD-10-CM

## 2016-04-03 DIAGNOSIS — R197 Diarrhea, unspecified: Secondary | ICD-10-CM | POA: Diagnosis not present

## 2016-04-03 DIAGNOSIS — IMO0002 Reserved for concepts with insufficient information to code with codable children: Secondary | ICD-10-CM

## 2016-04-03 NOTE — Assessment & Plan Note (Signed)
SYMPTOMS FAIRLY WELL CONTROLLED BUT NOT RESOLVED. ETIOLOGY FOR ABSCESS NOT CLEAR-POSSIBLE COMPLICATED DIVERTICULITIS.  COMPLETE COLONOSCOPY.  REPEAT CT SCAN ABD/PELVIS. SEE DR. Shearon Stalls FOR ID INPUT. FOLLOW UP IN 3 MOS.

## 2016-04-03 NOTE — Assessment & Plan Note (Signed)
SYMPTOMS NOT CONTROLLED.  OK TO USE IMODIUM ONCE OR TWICE DAILY TO SLOW DOWN OSTOMY OUTPUT. FOLLOW UP IN 3 MOS.

## 2016-04-03 NOTE — Progress Notes (Signed)
 Subjective:    Patient ID: [name] [name], female    DOB: 12/11/1960, 56 y.o.   MRN: [id number]  CAPLAN,MICHAEL, MD  HPI Been having trouble with abdominal pain. WAS TRANSFERRED TO [facility]. 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 LEFT COLECTOMY COMPLICATED BY ANASTOMOTIC LEAK/INTRAPERITONEAL ABSCESS REQUIRING  DRAINAGE AND C DIFF x 4. SEEN BY DR. DESAI. LAST VISIT WAS FEB 2017. HAS HAD CHRONIC ABDOMINAL PAIN SINCE SURGERY BUT IT GOT WORSE OVER SEVERAL MOS/FEVER JAN 2018 AND PT ADMITTED WITH LARGE ABSCESS(IP) ON LEFT SIDE. ABX FOR 3 WEEKS AND DRAINED. PAIN BETTER BUT NOT RESOLVED. COULDN'T HARDLY WALK. DRAIN PULLED ONE MO AGO. STAYS NAUSEATED. STILL HAS COLOSTOMY AND IT'S WATERY STOOLS. HAS ISSUES WITH DOESN'T FEEL LIKE SHE CAN BREATHE GOOD. NO ENERGY. HARDLY EVER SOFT/FORMED STOOL. REFLUX CONTROLLED.ALL OVER ABDOMINAL PAIN(CRAMPIN, OFF AND ON) AND KNIFE STABS ON SIDES. DOESN'T KEEP HER AWAKE. WEIGHT: USU. 260 LBS AND WAS 277 LBS.  PT DENIES FEVER, CHILLS, HEMATOCHEZIA, HEMATEMESIS, vomiting, melena, CHEST PAIN, CHANGE IN BOWEL IN HABITS, constipation, problems swallowing, problems with sedation, OR heartburn or indigestion.   Past Medical History:  Diagnosis Date  . Anemia   . Arthritis    "shoulders, ankles, back" (02/23/2016)  . Asthma   . COPD (chronic obstructive pulmonary disease) ([facility])   . Depression   . Diarrhea age 24  . Diverticulitis SEP 2015   PERFORATED, SIGMOID COLON  . GERD (gastroesophageal reflux disease)   . Heart murmur   . History of hiatal hernia   . HTN (hypertension)   . Hyperlipidemia   . Kidney disease    "had kidney disease when I was a child"  . OSA on CPAP 2015   nasal; uncertain setting  . Restless legs syndrome    Past Surgical History:  Procedure Laterality Date  . ABSCESS DRAINAGE  ~ 02/20/2016   into my stomach; radiology  . ANTERIOR AND POSTERIOR REPAIR     "w/my bladder tack"  . CERVICAL CONE BIOPSY    . COLONOSCOPY N/A 04/05/2014   SLF:small HH/moderate  diverticulosis  . 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 ESOPHAGOGASTRODUODENOSCOPY N/A 04/05/2014   SLF:multiple gastric polyps/mild erosive gastritis  . INCONTINENCE SURGERY     bladder tack  . IR GENERIC HISTORICAL  03/07/2016   IR RADIOLOGIST EVAL & MGMT 03/07/2016 [facility] INTERV RAD  . TUBAL LIGATION     Allergies  Allergen Reactions  . Penicillins Shortness Of Breath    Tolerates Zosyn  . Cortisone Hives  . Crestor [Rosuvastatin Calcium] Other (See Comments)    "started messing up my liver"  . Dilaudid [Hydromorphone Hcl] Other (See Comments)    Respiratory depression  . Neosporin [Neomycin-Bacitracin Zn-Polymyx] Hives  . Vancomycin Nausea And Vomiting    And Red Man's Syndrome   Current Outpatient Prescriptions  Medication Sig Dispense Refill  . albuterol (PROVENTIL HFA;VENTOLIN HFA) 108 (90 BASE) MCG/ACT inhaler Inhale into the lungs every 6 (six) hours as needed for wheezing or shortness of breath.    . atorvastatin (LIPITOR) 10 MG tablet Take 10 mg by mouth at bedtime.     . Calcium Carbonate-Vitamin D 600-400 MG-UNIT tablet Take 1 tablet by mouth daily.     . Fluticasone-Salmeterol (ADVAIR) 250-50 MCG/DOSE AEPB Inhale 1 puff into the lungs 2 (two) times daily.    . losartan (COZAAR) 50 MG tablet Take 50 mg by mouth daily.    . nebivolol (BYSTOLIC) 5 MG tablet Take 5 mg by mouth daily.    .   ondansetron (ZOFRAN) 4 MG tablet Take 1 tablet (4 mg total) by mouth every 6 (six) hours as needed for nausea. 1X/DAY   . oxyCODONE-acetaminophen (PERCOCET/ROXICET) 5-325 MG tablet Take 1 tablet by mouth every 6 (six) hours as needed for moderate pain. NONE   . pantoprazole (PROTONIX) 40 MG tablet Take 40 mg by mouth daily.    . polyethylene glycol powder (GLYCOLAX/MIRALAX) powder MIX 1 HEAPING TABLESPOONFUL IN 8 OUNCES OF A LIQUID AND DRINK BID PRF CONSTIPATION NONE   . pramipexole (MIRAPEX) 0.5 MG tablet Take 0.25 mg by mouth at bedtime.     . pramipexole (MIRAPEX) 0.5 MG tablet Take  0.5 mg by mouth at bedtime.     .      . venlafaxine XR (EFFEXOR-XR) 150 MG 24 hr capsule Take 150 mg by mouth daily with breakfast.     Review of Systems PER HPI OTHERWISE ALL SYSTEMS ARE NEGATIVE.    Objective:   Physical Exam  Constitutional: She is oriented to person, place, and time. She appears well-developed and well-nourished. No distress.  HENT:  Head: Normocephalic and atraumatic.  Mouth/Throat: Oropharynx is clear and moist. No oropharyngeal exudate.  Eyes: Pupils are equal, round, and reactive to light. No scleral icterus.  Neck: Normal range of motion. Neck supple.  Cardiovascular: Normal rate, regular rhythm and normal heart sounds.   Pulmonary/Chest: Effort normal and breath sounds normal. No respiratory distress.  Abdominal: Soft. Bowel sounds are normal. She exhibits no distension. There is no tenderness.  LUQ COLOSTOMY  Musculoskeletal: She exhibits no edema.  Lymphadenopathy:    She has no cervical adenopathy.  Neurological: She is alert and oriented to person, place, and time.  NO FOCAL DEFICITS  Psychiatric:  SLIGHTLY ANXIOUS MOOD, NL AFFECT, TEARFUL AT TIMES  Vitals reviewed.     Assessment & Plan:   

## 2016-04-03 NOTE — Patient Instructions (Addendum)
COMPLETE COLONOSCOPY. Full liquids with breakfast.  REPEAT CT SCAN ABD/PELVIS.  SEE DR. Shearon [name].  IT IS OK TO USE IMODIUM ONCE OR TWICE DAILY TO SLOW DOWN OSTOMY OUTPUT.  FOLLOW UP IN 3 MOS.   Full Liquid Diet A high-calorie, high-protein supplement should be used to meet your nutritional requirements when the full liquid diet is continued for more than 2 or 3 days. If this diet is to be used for an extended period of time (more than 7 days), a multivitamin should be considered.  Breads and Starches  Allowed: None are allowed   Avoid: Any others.    Potatoes/Pasta/Rice  Allowed: ANY ITEM AS A SOUP OR SMALL PLATE OF MASHED POTATOES OR SCRAMBLED EGGS. (DO NOT EAT MORE THAN ONE SERVING ON THE DAY BEFORE COLONOSCOPY).      Vegetables  Allowed: Strained tomato or vegetable juice. Vegetables pureed in soup.   Avoid: Any others.    Fruit  Allowed: Any strained fruit juices and fruit drinks. Include 1 serving of citrus or vitamin C-enriched fruit juice daily.   Avoid: Any others.  Meat and Meat Substitutes  Allowed: Egg  Avoid: Any meat, fish, or fowl. All cheese.  Milk  Allowed: SOY Milk beverages, including milk shakes and instant breakfast mixes. Smooth yogurt.   Avoid: Any others. Avoid dairy products if not tolerated.    Soups and Combination Foods  Allowed: Broth, strained cream soups. Strained, broth-based soups.   Avoid: Any others.    Desserts and Sweets  Allowed: flavored gelatin, tapioca, ice cream, sherbet, smooth pudding, junket, fruit ices, frozen ice pops, pudding pops, frozen fudge pops, chocolate syrup. Sugar, honey, jelly, syrup.   Avoid: Any others.  Fats and Oils  Allowed: Margarine, butter, cream, sour cream, oils.   Avoid: Any others.  Beverages  Allowed: All.   Avoid: None.  Condiments  Allowed: Iodized salt, pepper, spices, flavorings. Cocoa powder.   Avoid: Any others.    SAMPLE MEAL PLAN Breakfast   cup orange juice.    1 OR 2 EGGS  1 cup milk.   1 cup beverage (coffee or tea).   Cream or sugar, if desired.    Midmorning Snack  2 SCRAMBLED OR HARD BOILED EGG   Lunch  1 cup cream soup.    cup fruit juice.   1 cup milk.    cup custard.   1 cup beverage (coffee or tea).   Cream or sugar, if desired.    Midafternoon Snack  1 cup milk shake.  Dinner  1 cup cream soup.    cup fruit juice.   1 cup MILK    cup pudding.   1 cup beverage (coffee or tea).   Cream or sugar, if desired.  Evening Snack  1 cup supplement.  To increase calories, add sugar, cream, butter, or margarine if possible. Nutritional supplements will also increase the total calories.

## 2016-04-04 ENCOUNTER — Other Ambulatory Visit: Payer: BLUE CROSS/BLUE SHIELD

## 2016-04-04 NOTE — Patient Instructions (Signed)
PA#  [id number] for CT SCAN

## 2016-04-04 NOTE — Progress Notes (Signed)
On recall  °

## 2016-04-05 NOTE — Progress Notes (Signed)
CC'ED TO PCP 

## 2016-04-12 NOTE — Patient Instructions (Signed)
[place] Fitting  04/12/2016     @PREFPERIOPPHARMACY @   Your procedure is scheduled on  04/23/2016   Report to [facility] at  900   A.M.  Call this number if you have problems the morning of surgery:  [phone]   Remember:  Do not eat food or drink liquids after midnight.  Take these medicines the morning of surgery with A SIP OF WATER  Losartan, nebivolol, zofran, oxycodone, protonix, effexor.   Do not wear jewelry, make-up or nail polish.  Do not wear lotions, powders, or perfumes, or deoderant.  Do not shave 48 hours prior to surgery.  Men may shave face and neck.  Do not bring valuables to the hospital.  [facility] is not responsible for any belongings or valuables.  Contacts, dentures or bridgework may not be worn into surgery.  Leave your suitcase in the car.  After surgery it may be brought to your room.  For patients admitted to the hospital, discharge time will be determined by your treatment team.  Patients discharged the day of surgery will not be allowed to drive home.   Name and phone number of your driver:   family Special instructions:  Follow the diet and prep instructions given to you by Dr Nona Dell office.  Please read over the following fact sheets that you were given. Anesthesia Post-op Instructions and Care and Recovery After Surgery       Colonoscopy, Adult A colonoscopy is an exam to look at the entire large intestine. During the exam, a lubricated, bendable tube is inserted into the anus and then passed into the rectum, colon, and other parts of the large intestine. A colonoscopy is often done as a part of normal colorectal screening or in response to certain symptoms, such as anemia, persistent diarrhea, abdominal pain, and blood in the stool. The exam can help screen for and diagnose medical problems, including:  Tumors.  Polyps.  Inflammation.  Areas of bleeding. Tell a health care provider about:  Any allergies you  have.  All medicines you are taking, including vitamins, herbs, eye drops, creams, and over-the-counter medicines.  Any problems you or family members have had with anesthetic medicines.  Any blood disorders you have.  Any surgeries you have had.  Any medical conditions you have.  Any problems you have had passing stool. What are the risks? Generally, this is a safe procedure. However, problems may occur, including:  Bleeding.  A tear in the intestine.  A reaction to medicines given during the exam.  Infection (rare). What happens before the procedure? Eating and drinking restrictions  Follow instructions from your health care provider about eating and drinking, which may include:  A few days before the procedure - follow a low-fiber diet. Avoid nuts, seeds, dried fruit, raw fruits, and vegetables.  1-3 days before the procedure - follow a clear liquid diet. Drink only clear liquids, such as clear broth or bouillon, black coffee or tea, clear juice, clear soft drinks or sports drinks, gelatin dessert, and popsicles. Avoid any liquids that contain red or purple dye.  On the day of the procedure - do not eat or drink anything during the 2 hours before the procedure, or within the time period that your health care provider recommends. Bowel prep  If you were prescribed an oral bowel prep to clean out your colon:  Take it as told by your health care provider. Starting the day before  your procedure, you will need to drink a large amount of medicated liquid. The liquid will cause you to have multiple loose stools until your stool is almost clear or light green.  If your skin or anus gets irritated from diarrhea, you may use these to relieve the irritation:  Medicated wipes, such as adult wet wipes with aloe and vitamin E.  A skin soothing-product like petroleum jelly.  If you vomit while drinking the bowel prep, take a break for up to 60 minutes and then begin the bowel prep  again. If vomiting continues and you cannot take the bowel prep without vomiting, call your health care provider. General instructions   Ask your health care provider about changing or stopping your regular medicines. This is especially important if you are taking diabetes medicines or blood thinners.  Plan to have someone take you home from the hospital or clinic. What happens during the procedure?  An IV tube may be inserted into one of your veins.  You will be given medicine to help you relax (sedative).  To reduce your risk of infection:  Your health care team will wash or sanitize their hands.  Your anal area will be washed with soap.  You will be asked to lie on your side with your knees bent.  Your health care provider will lubricate a long, thin, flexible tube. The tube will have a camera and a light on the end.  The tube will be inserted into your anus.  The tube will be gently eased through your rectum and colon.  Air will be delivered into your colon to keep it open. You may feel some pressure or cramping.  The camera will be used to take images during the procedure.  A small tissue sample may be removed from your body to be examined under a microscope (biopsy). If any potential problems are found, the tissue will be sent to a lab for testing.  If small polyps are found, your health care provider may remove them and have them checked for cancer cells.  The tube that was inserted into your anus will be slowly removed. The procedure may vary among health care providers and hospitals. What happens after the procedure?  Your blood pressure, heart rate, breathing rate, and blood oxygen level will be monitored until the medicines you were given have worn off.  Do not drive for 24 hours after the exam.  You may have a small amount of blood in your stool.  You may pass gas and have mild abdominal cramping or bloating due to the air that was used to inflate your colon  during the exam.  It is up to you to get the results of your procedure. Ask your health care provider, or the department performing the procedure, when your results will be ready. This information is not intended to replace advice given to you by your health care provider. Make sure you discuss any questions you have with your health care provider. Document Released: 01/19/2000 Document Revised: 11/22/2015 Document Reviewed: 04/04/2015 Elsevier Interactive Patient Education  2017 [employer]  Colonoscopy, Adult, Care After This sheet gives you information about how to care for yourself after your procedure. Your health care provider may also give you more specific instructions. If you have problems or questions, contact your health care provider. What can I expect after the procedure? After the procedure, it is common to have:  A small amount of blood in your stool for 24 hours after the  procedure.  Some gas.  Mild abdominal cramping or bloating. Follow these instructions at home: General instructions    For the first 24 hours after the procedure:  Do not drive or use machinery.  Do not sign important documents.  Do not drink alcohol.  Do your regular daily activities at a slower pace than normal.  Eat soft, easy-to-digest foods.  Rest often.  Take over-the-counter or prescription medicines only as told by your health care provider.  It is up to you to get the results of your procedure. Ask your health care provider, or the department performing the procedure, when your results will be ready. Relieving cramping and bloating   Try walking around when you have cramps or feel bloated.  Apply heat to your abdomen as told by your health care provider. Use a heat source that your health care provider recommends, such as a moist heat pack or a heating pad.  Place a towel between your skin and the heat source.  Leave the heat on for 20-30 minutes.  Remove the heat if your  skin turns bright red. This is especially important if you are unable to feel pain, heat, or cold. You may have a greater risk of getting burned. Eating and drinking   Drink enough fluid to keep your urine clear or pale yellow.  Resume your normal diet as instructed by your health care provider. Avoid heavy or fried foods that are hard to digest.  Avoid drinking alcohol for as long as instructed by your health care provider. Contact a health care provider if:  You have blood in your stool 2-3 days after the procedure. Get help right away if:  You have more than a small spotting of blood in your stool.  You pass large blood clots in your stool.  Your abdomen is swollen.  You have nausea or vomiting.  You have a fever.  You have increasing abdominal pain that is not relieved with medicine. This information is not intended to replace advice given to you by your health care provider. Make sure you discuss any questions you have with your health care provider. Document Released: 09/05/2003 Document Revised: 10/16/2015 Document Reviewed: 04/04/2015 Elsevier Interactive Patient Education  2017 [place] Anesthesia is a term that refers to techniques, procedures, and medicines that help a person stay safe and comfortable during a medical procedure. Monitored anesthesia care, or sedation, is one type of anesthesia. Your anesthesia specialist may recommend sedation if you will be having a procedure that does not require you to be unconscious, such as:  Cataract surgery.  A dental procedure.  A biopsy.  A colonoscopy. During the procedure, you may receive a medicine to help you relax (sedative). There are three levels of sedation:  Mild sedation. At this level, you may feel awake and relaxed. You will be able to follow directions.  Moderate sedation. At this level, you will be sleepy. You may not remember the procedure.  Deep sedation. At this level,  you will be asleep. You will not remember the procedure. The more medicine you are given, the deeper your level of sedation will be. Depending on how you respond to the procedure, the anesthesia specialist may change your level of sedation or the type of anesthesia to fit your needs. An anesthesia specialist will monitor you closely during the procedure. Let your health care provider know about:  Any allergies you have.  All medicines you are taking, including vitamins, herbs, eye drops,  creams, and over-the-counter medicines.  Any use of steroids (by mouth or as a cream).  Any problems you or family members have had with sedatives and anesthetic medicines.  Any blood disorders you have.  Any surgeries you have had.  Any medical conditions you have, such as sleep apnea.  Whether you are pregnant or may be pregnant.  Any use of cigarettes, alcohol, or street drugs. What are the risks? Generally, this is a safe procedure. However, problems may occur, including:  Getting too much medicine (oversedation).  Nausea.  Allergic reaction to medicines.  Trouble breathing. If this happens, a breathing tube may be used to help with breathing. It will be removed when you are awake and breathing on your own.  Heart trouble.  Lung trouble. Before the procedure Staying hydrated  Follow instructions from your health care provider about hydration, which may include:  Up to 2 hours before the procedure - you may continue to drink clear liquids, such as water, clear fruit juice, black coffee, and plain tea. Eating and drinking restrictions  Follow instructions from your health care provider about eating and drinking, which may include:  8 hours before the procedure - stop eating heavy meals or foods such as meat, fried foods, or fatty foods.  6 hours before the procedure - stop eating light meals or foods, such as toast or cereal.  6 hours before the procedure - stop drinking milk or  drinks that contain milk.  2 hours before the procedure - stop drinking clear liquids. Medicines  Ask your health care provider about:  Changing or stopping your regular medicines. This is especially important if you are taking diabetes medicines or blood thinners.  Taking medicines such as aspirin and ibuprofen. These medicines can thin your blood. Do not take these medicines before your procedure if your health care provider instructs you not to. Tests and exams  You will have a physical exam.  You may have blood tests done to show:  How well your kidneys and liver are working.  How well your blood can clot.  General instructions  Plan to have someone take you home from the hospital or clinic.  If you will be going home right after the procedure, plan to have someone with you for 24 hours. What happens during the procedure?  Your blood pressure, heart rate, breathing, level of pain and overall condition will be monitored.  An IV tube will be inserted into one of your veins.  Your anesthesia specialist will give you medicines as needed to keep you comfortable during the procedure. This may mean changing the level of sedation.  The procedure will be performed. After the procedure  Your blood pressure, heart rate, breathing rate, and blood oxygen level will be monitored until the medicines you were given have worn off.  Do not drive for 24 hours if you received a sedative.  You may:  Feel sleepy, clumsy, or nauseous.  Feel forgetful about what happened after the procedure.  Have a sore throat if you had a breathing tube during the procedure.  Vomit. This information is not intended to replace advice given to you by your health care provider. Make sure you discuss any questions you have with your health care provider. Document Released: 10/17/2004 Document Revised: 06/30/2015 Document Reviewed: 05/14/2015 Elsevier Interactive Patient Education  2017 [place], Care After These instructions provide you with information about caring for yourself after your procedure. Your health care provider may also  give you more specific instructions. Your treatment has been planned according to current medical practices, but problems sometimes occur. Call your health care provider if you have any problems or questions after your procedure. What can I expect after the procedure? After your procedure, it is common to:  Feel sleepy for several hours.  Feel clumsy and have poor balance for several hours.  Feel forgetful about what happened after the procedure.  Have poor judgment for several hours.  Feel nauseous or vomit.  Have a sore throat if you had a breathing tube during the procedure. Follow these instructions at home: For at least 24 hours after the procedure:    Do not:  Participate in activities in which you could fall or become injured.  Drive.  Use heavy machinery.  Drink alcohol.  Take sleeping pills or medicines that cause drowsiness.  Make important decisions or sign legal documents.  Take care of children on your own.  Rest. Eating and drinking   Follow the diet that is recommended by your health care provider.  If you vomit, drink water, juice, or soup when you can drink without vomiting.  Make sure you have little or no nausea before eating solid foods. General instructions   Have a responsible adult stay with you until you are awake and alert.  Take over-the-counter and prescription medicines only as told by your health care provider.  If you smoke, do not smoke without supervision.  Keep all follow-up visits as told by your health care provider. This is important. Contact a health care provider if:  You keep feeling nauseous or you keep vomiting.  You feel light-headed.  You develop a rash.  You have a fever. Get help right away if:  You have trouble breathing. This  information is not intended to replace advice given to you by your health care provider. Make sure you discuss any questions you have with your health care provider. Document Released: 05/14/2015 Document Revised: 09/13/2015 Document Reviewed: 05/14/2015 Elsevier Interactive Patient Education  2017 [employer].

## 2016-04-15 ENCOUNTER — Ambulatory Visit (HOSPITAL_COMMUNITY)
Admission: RE | Admit: 2016-04-15 | Discharge: 2016-04-15 | Disposition: A | Discharge status: Home / Self Care | Payer: BLUE CROSS/BLUE SHIELD | Source: Ambulatory Visit | Attending: Gastroenterology | Admitting: Gastroenterology

## 2016-04-15 DIAGNOSIS — K760 Fatty (change of) liver, not elsewhere classified: Secondary | ICD-10-CM | POA: Diagnosis not present

## 2016-04-15 DIAGNOSIS — I700 Atherosclerosis of aorta: Secondary | ICD-10-CM | POA: Insufficient documentation

## 2016-04-15 DIAGNOSIS — R109 Unspecified abdominal pain: Secondary | ICD-10-CM | POA: Insufficient documentation

## 2016-04-15 DIAGNOSIS — IMO0002 Reserved for concepts with insufficient information to code with codable children: Secondary | ICD-10-CM

## 2016-04-15 DIAGNOSIS — Z9049 Acquired absence of other specified parts of digestive tract: Secondary | ICD-10-CM | POA: Insufficient documentation

## 2016-04-15 DIAGNOSIS — K651 Peritoneal abscess: Secondary | ICD-10-CM | POA: Diagnosis not present

## 2016-04-15 DIAGNOSIS — Z933 Colostomy status: Secondary | ICD-10-CM | POA: Diagnosis not present

## 2016-04-15 DIAGNOSIS — R197 Diarrhea, unspecified: Secondary | ICD-10-CM | POA: Insufficient documentation

## 2016-04-15 LAB — POCT I-STAT CREATININE: Creatinine, Ser: 0.7 mg/dL (ref 0.44–1.00)

## 2016-04-15 MED ORDER — IOPAMIDOL (ISOVUE-300) INJECTION 61%
100.0000 mL | Freq: Once | INTRAVENOUS | Status: AC | PRN
  Administered 2016-04-15: 100 mL via INTRAVENOUS

## 2016-04-17 ENCOUNTER — Inpatient Hospital Stay (HOSPITAL_COMMUNITY)
Admission: RE | Admit: 2016-04-17 | Discharge: 2016-04-17 | Disposition: A | Discharge status: Home / Self Care | Payer: BLUE CROSS/BLUE SHIELD | Source: Ambulatory Visit

## 2016-04-18 ENCOUNTER — Encounter (HOSPITAL_COMMUNITY)
Admission: RE | Admit: 2016-04-18 | Discharge: 2016-04-18 | Disposition: A | Discharge status: Home / Self Care | Payer: BLUE CROSS/BLUE SHIELD | Source: Ambulatory Visit | Attending: Gastroenterology | Admitting: Gastroenterology

## 2016-04-18 ENCOUNTER — Encounter (HOSPITAL_COMMUNITY): Payer: Self-pay

## 2016-04-18 DIAGNOSIS — R197 Diarrhea, unspecified: Secondary | ICD-10-CM | POA: Insufficient documentation

## 2016-04-18 DIAGNOSIS — Z01812 Encounter for preprocedural laboratory examination: Secondary | ICD-10-CM | POA: Diagnosis present

## 2016-04-18 DIAGNOSIS — K651 Peritoneal abscess: Secondary | ICD-10-CM | POA: Insufficient documentation

## 2016-04-18 DIAGNOSIS — R1013 Epigastric pain: Secondary | ICD-10-CM | POA: Insufficient documentation

## 2016-04-18 DIAGNOSIS — K5792 Diverticulitis of intestine, part unspecified, without perforation or abscess without bleeding: Secondary | ICD-10-CM | POA: Diagnosis not present

## 2016-04-18 DIAGNOSIS — Z01810 Encounter for preprocedural cardiovascular examination: Secondary | ICD-10-CM | POA: Insufficient documentation

## 2016-04-18 DIAGNOSIS — D649 Anemia, unspecified: Secondary | ICD-10-CM | POA: Insufficient documentation

## 2016-04-18 DIAGNOSIS — I10 Essential (primary) hypertension: Secondary | ICD-10-CM | POA: Insufficient documentation

## 2016-04-18 LAB — BASIC METABOLIC PANEL
Anion gap: 8 (ref 5–15)
BUN: 22 mg/dL — AB (ref 6–20)
CALCIUM: 9.3 mg/dL (ref 8.9–10.3)
CO2: 24 mmol/L (ref 22–32)
CREATININE: 1.04 mg/dL — AB (ref 0.44–1.00)
Chloride: 106 mmol/L (ref 101–111)
GFR calc Af Amer: >60 mL/min (ref >60)
GFR, EST NON AFRICAN AMERICAN: 60 mL/min — AB (ref >60)
GLUCOSE: 95 mg/dL (ref 65–99)
Potassium: 4.1 mmol/L (ref 3.5–5.1)
Sodium: 138 mmol/L (ref 135–145)

## 2016-04-18 LAB — CBC WITH DIFFERENTIAL/PLATELET
BASOS ABS: 0 10*3/uL (ref 0.0–0.1)
Basophils Relative: 0 %
EOS PCT: 6 %
Eosinophils Absolute: 0.6 10*3/uL (ref 0.0–0.7)
HEMATOCRIT: 38.6 % (ref 36.0–46.0)
Hemoglobin: 12.5 g/dL (ref 12.0–15.0)
Lymphocytes Relative: 20 %
Lymphs Abs: 2.1 10*3/uL (ref 0.7–4.0)
MCH: 27.1 pg (ref 26.0–34.0)
MCHC: 32.4 g/dL (ref 30.0–36.0)
MCV: 83.7 fL (ref 78.0–100.0)
MONO ABS: 0.8 10*3/uL (ref 0.1–1.0)
MONOS PCT: 7 %
Neutro Abs: 6.8 10*3/uL (ref 1.7–7.7)
Neutrophils Relative %: 67 %
PLATELETS: 273 10*3/uL (ref 150–400)
RBC: 4.61 MIL/uL (ref 3.87–5.11)
RDW: 16 % — AB (ref 11.5–15.5)
WBC: 10.3 10*3/uL (ref 4.0–10.5)

## 2016-04-23 ENCOUNTER — Encounter (HOSPITAL_COMMUNITY)
Admission: RE | Disposition: A | Discharge status: Home / Self Care | Payer: Self-pay | Source: Ambulatory Visit | Attending: Gastroenterology

## 2016-04-23 ENCOUNTER — Ambulatory Visit (HOSPITAL_COMMUNITY): Payer: BLUE CROSS/BLUE SHIELD | Admitting: Anesthesiology

## 2016-04-23 ENCOUNTER — Encounter (HOSPITAL_COMMUNITY): Payer: Self-pay | Admitting: *Deleted

## 2016-04-23 ENCOUNTER — Ambulatory Visit (HOSPITAL_COMMUNITY)
Admission: RE | Admit: 2016-04-23 | Discharge: 2016-04-23 | Disposition: A | Discharge status: Home / Self Care | Payer: BLUE CROSS/BLUE SHIELD | Source: Ambulatory Visit | Attending: Gastroenterology | Admitting: Gastroenterology

## 2016-04-23 DIAGNOSIS — Z888 Allergy status to other drugs, medicaments and biological substances status: Secondary | ICD-10-CM | POA: Diagnosis not present

## 2016-04-23 DIAGNOSIS — Z79899 Other long term (current) drug therapy: Secondary | ICD-10-CM | POA: Diagnosis not present

## 2016-04-23 DIAGNOSIS — G2581 Restless legs syndrome: Secondary | ICD-10-CM | POA: Diagnosis not present

## 2016-04-23 DIAGNOSIS — K5730 Diverticulosis of large intestine without perforation or abscess without bleeding: Secondary | ICD-10-CM

## 2016-04-23 DIAGNOSIS — F329 Major depressive disorder, single episode, unspecified: Secondary | ICD-10-CM | POA: Insufficient documentation

## 2016-04-23 DIAGNOSIS — Z933 Colostomy status: Secondary | ICD-10-CM | POA: Insufficient documentation

## 2016-04-23 DIAGNOSIS — M19011 Primary osteoarthritis, right shoulder: Secondary | ICD-10-CM | POA: Diagnosis not present

## 2016-04-23 DIAGNOSIS — Z881 Allergy status to other antibiotic agents status: Secondary | ICD-10-CM | POA: Diagnosis not present

## 2016-04-23 DIAGNOSIS — Z8614 Personal history of Methicillin resistant Staphylococcus aureus infection: Secondary | ICD-10-CM | POA: Insufficient documentation

## 2016-04-23 DIAGNOSIS — E785 Hyperlipidemia, unspecified: Secondary | ICD-10-CM | POA: Diagnosis not present

## 2016-04-23 DIAGNOSIS — M19012 Primary osteoarthritis, left shoulder: Secondary | ICD-10-CM | POA: Diagnosis not present

## 2016-04-23 DIAGNOSIS — G8929 Other chronic pain: Secondary | ICD-10-CM | POA: Insufficient documentation

## 2016-04-23 DIAGNOSIS — G4733 Obstructive sleep apnea (adult) (pediatric): Secondary | ICD-10-CM | POA: Diagnosis not present

## 2016-04-23 DIAGNOSIS — M19071 Primary osteoarthritis, right ankle and foot: Secondary | ICD-10-CM | POA: Insufficient documentation

## 2016-04-23 DIAGNOSIS — D121 Benign neoplasm of appendix: Secondary | ICD-10-CM | POA: Diagnosis not present

## 2016-04-23 DIAGNOSIS — K5732 Diverticulitis of large intestine without perforation or abscess without bleeding: Secondary | ICD-10-CM

## 2016-04-23 DIAGNOSIS — Z880 Allergy status to penicillin: Secondary | ICD-10-CM | POA: Diagnosis not present

## 2016-04-23 DIAGNOSIS — M479 Spondylosis, unspecified: Secondary | ICD-10-CM | POA: Diagnosis not present

## 2016-04-23 DIAGNOSIS — J449 Chronic obstructive pulmonary disease, unspecified: Secondary | ICD-10-CM | POA: Insufficient documentation

## 2016-04-23 DIAGNOSIS — K648 Other hemorrhoids: Secondary | ICD-10-CM

## 2016-04-23 DIAGNOSIS — K635 Polyp of colon: Secondary | ICD-10-CM

## 2016-04-23 DIAGNOSIS — M19072 Primary osteoarthritis, left ankle and foot: Secondary | ICD-10-CM | POA: Diagnosis not present

## 2016-04-23 DIAGNOSIS — I10 Essential (primary) hypertension: Secondary | ICD-10-CM | POA: Diagnosis not present

## 2016-04-23 DIAGNOSIS — K219 Gastro-esophageal reflux disease without esophagitis: Secondary | ICD-10-CM | POA: Insufficient documentation

## 2016-04-23 DIAGNOSIS — R1031 Right lower quadrant pain: Secondary | ICD-10-CM | POA: Diagnosis not present

## 2016-04-23 DIAGNOSIS — R011 Cardiac murmur, unspecified: Secondary | ICD-10-CM | POA: Diagnosis not present

## 2016-04-23 DIAGNOSIS — K449 Diaphragmatic hernia without obstruction or gangrene: Secondary | ICD-10-CM | POA: Diagnosis not present

## 2016-04-23 HISTORY — PX: POLYPECTOMY: SHX5525

## 2016-04-23 HISTORY — PX: COLONOSCOPY WITH PROPOFOL: SHX5780

## 2016-04-23 SURGERY — COLONOSCOPY WITH PROPOFOL
Anesthesia: Monitor Anesthesia Care

## 2016-04-23 MED ORDER — FENTANYL CITRATE (PF) 100 MCG/2ML IJ SOLN
25.0000 ug | Freq: Once | INTRAMUSCULAR | Status: AC
  Administered 2016-04-23: 25 ug via INTRAVENOUS

## 2016-04-23 MED ORDER — EPHEDRINE SULFATE 50 MG/ML IJ SOLN
INTRAMUSCULAR | Status: DC | PRN
  Administered 2016-04-23: 10 mg via INTRAVENOUS

## 2016-04-23 MED ORDER — PROPOFOL 10 MG/ML IV BOLUS
INTRAVENOUS | Status: AC
  Filled 2016-04-23: qty 40

## 2016-04-23 MED ORDER — MIDAZOLAM HCL 5 MG/5ML IJ SOLN
INTRAMUSCULAR | Status: DC | PRN
  Administered 2016-04-23: 2 mg via INTRAVENOUS

## 2016-04-23 MED ORDER — PROPOFOL 500 MG/50ML IV EMUL
INTRAVENOUS | Status: DC | PRN
  Administered 2016-04-23: 100 ug/kg/min via INTRAVENOUS
  Administered 2016-04-23: 12:00:00 via INTRAVENOUS

## 2016-04-23 MED ORDER — MIDAZOLAM HCL 2 MG/2ML IJ SOLN
1.0000 mg | INTRAMUSCULAR | Status: AC
  Administered 2016-04-23: 2 mg via INTRAVENOUS
  Filled 2016-04-23: qty 2

## 2016-04-23 MED ORDER — CHLORHEXIDINE GLUCONATE CLOTH 2 % EX PADS: 6.0000 | MEDICATED_PAD | Freq: Once | CUTANEOUS | Status: DC

## 2016-04-23 MED ORDER — LACTATED RINGERS IV SOLN
INTRAVENOUS | Status: DC
  Administered 2016-04-23: 1000 mL via INTRAVENOUS

## 2016-04-23 MED ORDER — FENTANYL CITRATE (PF) 100 MCG/2ML IJ SOLN
INTRAMUSCULAR | Status: AC
  Filled 2016-04-23: qty 2

## 2016-04-23 MED ORDER — MIDAZOLAM HCL 2 MG/2ML IJ SOLN
INTRAMUSCULAR | Status: AC
  Filled 2016-04-23: qty 2

## 2016-04-23 NOTE — Interval H&P Note (Signed)
History and Physical Interval Note:  04/23/2016 11:55 AM  [name] [name]  has presented today for surgery, with the diagnosis of DIVERTICULITIS.  The various methods of treatment have been discussed with the patient and family. After consideration of risks, benefits and other options for treatment, the patient has consented to  Procedure(s) with comments: COLONOSCOPY WITH PROPOFOL (N/A) - 1030 as a surgical intervention .  The patient's history has been reviewed, patient examined, no change in status, stable for surgery.  I have reviewed the patient's chart and labs.  Questions were answered to the patient's satisfaction.     [employer]

## 2016-04-23 NOTE — Discharge Instructions (Signed)
You had 1 small polyp removed. YOU HAVE DIVERTICULOSIS IN WHAT REMAINS OF YOUR COLORECTAL STUMP. You have SMALL internal hemorrhoids.   FOLLOW A HIGH FIBER DIET. AVOID ITEMS THAT CAUSE BLOATING & GAS. SEE INFO BELOW.  YOUR BIOPSY RESULTS WILL BE AVAILABLE IN MY CHART AFTER MAR 26 AND MY OFFICE WILL CONTACT YOU IN 10-14 DAYS WITH YOUR RESULTS.   Next colonoscopy in 5-10 years.    Colonoscopy Care After Read the instructions outlined below and refer to this sheet in the next week. These discharge instructions provide you with general information on caring for yourself after you leave the hospital. While your treatment has been planned according to the most current medical practices available, unavoidable complications occasionally occur. If you have any problems or questions after discharge, call DR. Nejla Reasor, [phone].  ACTIVITY  You may resume your regular activity, but move at a slower pace for the next 24 hours.   Take frequent rest periods for the next 24 hours.   Walking will help get rid of the air and reduce the bloated feeling in your belly (abdomen).   No driving for 24 hours (because of the medicine (anesthesia) used during the test).   You may shower.   Do not sign any important legal documents or operate any machinery for 24 hours (because of the anesthesia used during the test).    NUTRITION  Drink plenty of fluids.   You may resume your normal diet as instructed by your doctor.   Begin with a light meal and progress to your normal diet. Heavy or fried foods are harder to digest and may make you feel sick to your stomach (nauseated).   Avoid alcoholic beverages for 24 hours or as instructed.    MEDICATIONS  You may resume your normal medications.   WHAT YOU CAN EXPECT TODAY  Some feelings of bloating in the abdomen.   Passage of more gas than usual.   Spotting of blood in your stool or on the toilet paper  .  IF YOU HAD POLYPS REMOVED DURING THE  COLONOSCOPY:  Eat a soft diet IF YOU HAVE NAUSEA, BLOATING, ABDOMINAL PAIN, OR VOMITING.    FINDING OUT THE RESULTS OF YOUR TEST Not all test results are available during your visit. DR. Oneida Alar WILL CALL YOU WITHIN 14 DAYS OF YOUR PROCEDUE WITH YOUR RESULTS. Do not assume everything is normal if you have not heard from DR. Audray Rumore, CALL HER OFFICE AT [phone].  SEEK IMMEDIATE MEDICAL ATTENTION AND CALL THE OFFICE: [phone] IF:  You have more than a spotting of blood in your stool.   Your belly is swollen (abdominal distention).   You are nauseated or vomiting.   You have a temperature over 101F.   You have abdominal pain or discomfort that is severe or gets worse throughout the day.   High-Fiber Diet A high-fiber diet changes your normal diet to include more whole grains, legumes, fruits, and vegetables. Changes in the diet involve replacing refined carbohydrates with unrefined foods. The calorie level of the diet is essentially unchanged. The Dietary Reference Intake (recommended amount) for adult males is 38 grams per day. For adult females, it is 25 grams per day. Pregnant and lactating women should consume 28 grams of fiber per day. Fiber is the intact part of a plant that is not broken down during digestion. Functional fiber is fiber that has been isolated from the plant to provide a beneficial effect in the body. PURPOSE  Increase stool bulk.  Ease and regulate bowel movements.   Lower cholesterol.   REDUCE RISK OF COLON CANCER  INDICATIONS THAT YOU NEED MORE FIBER  Constipation and hemorrhoids.   Uncomplicated diverticulosis (intestine condition) and irritable bowel syndrome.   Weight management.   As a protective measure against hardening of the arteries (atherosclerosis), diabetes, and cancer.   GUIDELINES FOR INCREASING FIBER IN THE DIET  Start adding fiber to the diet slowly. A gradual increase of about 5 more grams (2 slices of whole-wheat bread, 2  servings of most fruits or vegetables, or 1 bowl of high-fiber cereal) per day is best. Too rapid an increase in fiber may result in constipation, flatulence, and bloating.   Drink enough water and fluids to keep your urine clear or pale yellow. Water, juice, or caffeine-free drinks are recommended. Not drinking enough fluid may cause constipation.   Eat a variety of high-fiber foods rather than one type of fiber.   Try to increase your intake of fiber through using high-fiber foods rather than fiber pills or supplements that contain small amounts of fiber.   The goal is to change the types of food eaten. Do not supplement your present diet with high-fiber foods, but replace foods in your present diet.  INCLUDE A VARIETY OF FIBER SOURCES  Replace refined and processed grains with whole grains, canned fruits with fresh fruits, and incorporate other fiber sources. White rice, white breads, and most bakery goods contain little or no fiber.   Brown whole-grain rice, buckwheat oats, and many fruits and vegetables are all good sources of fiber. These include: broccoli, Brussels sprouts, cabbage, cauliflower, beets, sweet potatoes, white potatoes (skin on), carrots, tomatoes, eggplant, squash, berries, fresh fruits, and dried fruits.   Cereals appear to be the richest source of fiber. Cereal fiber is found in whole grains and bran. Bran is the fiber-rich outer coat of cereal grain, which is largely removed in refining. In whole-grain cereals, the bran remains. In breakfast cereals, the largest amount of fiber is found in those with "bran" in their names. The fiber content is sometimes indicated on the label.   You may need to include additional fruits and vegetables each day.   In baking, for 1 cup white flour, you may use the following substitutions:   1 cup whole-wheat flour minus 2 tablespoons.   1/2 cup white flour plus 1/2 cup whole-wheat flour.   Polyps, Colon  A polyp is extra tissue that  grows inside your body. Colon polyps grow in the large intestine. The large intestine, also called the colon, is part of your digestive system. It is a long, hollow tube at the end of your digestive tract where your body makes and stores stool. Most polyps are not dangerous. They are benign. This means they are not cancerous. But over time, some types of polyps can turn into cancer. Polyps that are smaller than a pea are usually not harmful. But larger polyps could someday become or may already be cancerous. To be safe, doctors remove all polyps and test them.   WHO GETS POLYPS? Anyone can get polyps, but certain people are more likely than others. You may have a greater chance of getting polyps if:  You are over 50.   You have had polyps before.   Someone in your family has had polyps.   Someone in your family has had cancer of the large intestine.   Find out if someone in your family has had polyps. You may also be more  likely to get polyps if you:   Eat a lot of fatty foods   Smoke   Drink alcohol   Do not exercise  Eat too much   TREATMENT  The caregiver will remove the polyp during sigmoidoscopy or colonoscopy.    PREVENTION There is not one sure way to prevent polyps. You might be able to lower your risk of getting them if you:  Eat more fruits and vegetables and less fatty food.   Do not smoke.   Avoid alcohol.   Exercise every day.   Lose weight if you are overweight.   Eating more calcium and folate can also lower your risk of getting polyps. Some foods that are rich in calcium are milk, cheese, and broccoli. Some foods that are rich in folate are chickpeas, kidney beans, and spinach.   Hemorrhoids Hemorrhoids are dilated (enlarged) veins around the rectum. Sometimes clots will form in the veins. This makes them swollen and painful. These are called thrombosed hemorrhoids. Causes of hemorrhoids include:  Constipation.   Straining to have a bowel movement.    HEAVY LIFTING HOME CARE INSTRUCTIONS  Eat a well balanced diet and drink 6 to 8 glasses of water every day to avoid constipation. You may also use a bulk laxative.   Avoid straining to have bowel movements.   Keep anal area dry and clean.   Do not use a donut shaped pillow or sit on the toilet for long periods. This increases blood pooling and pain.   Move your bowels when your body has the urge; this will require less straining and will decrease pain and pressure.

## 2016-04-23 NOTE — Transfer of Care (Signed)
Immediate Anesthesia Transfer of Care Note  Patient: [name] [name]  Procedure(s) Performed: Procedure(s) with comments: COLONOSCOPY WITH PROPOFOL (N/A) - 1030 POLYPECTOMY - colon  Patient Location: PACU  Anesthesia Type:MAC  Level of Consciousness: awake and patient cooperative  Airway & Oxygen Therapy: Patient Spontanous Breathing and Patient connected to face mask oxygen  Post-op Assessment: Report given to RN, Post -op Vital signs reviewed and stable and Patient moving all extremities  Post vital signs: Reviewed and stable  Last Vitals:  Vitals:   04/23/16 1145 04/23/16 1200  BP: 114/67 112/70  Resp: (!) 34 11  Temp:      Last Pain:  Vitals:   04/23/16 0924  TempSrc: Oral      Patients Stated Pain Goal: 7 (17/40/81 [id number])  Complications: No apparent anesthesia complications

## 2016-04-23 NOTE — Anesthesia Postprocedure Evaluation (Signed)
Anesthesia Post Note  Patient: Kem Parkinson  Procedure(s) Performed: Procedure(s) (LRB): COLONOSCOPY WITH PROPOFOL (N/A) POLYPECTOMY  Patient location during evaluation: PACU Anesthesia Type: MAC Level of consciousness: patient cooperative Pain management: pain level controlled Vital Signs Assessment: post-procedure vital signs reviewed and stable Respiratory status: spontaneous breathing, nonlabored ventilation and respiratory function stable Cardiovascular status: blood pressure returned to baseline Postop Assessment: no signs of nausea or vomiting Anesthetic complications: no     Last Vitals:  Vitals:   04/23/16 1145 04/23/16 1200  BP: 114/67 112/70  Resp: (!) 34 11  Temp:      Last Pain:  Vitals:   04/23/16 0924  TempSrc: Oral                 Jackalyn Haith J

## 2016-04-23 NOTE — Anesthesia Preprocedure Evaluation (Signed)
Anesthesia Evaluation  Patient identified by MRN, date of birth, ID band Patient awake    Reviewed: Allergy & Precautions, NPO status , Patient's Chart, lab work & pertinent test results  Airway Mallampati: II  TM Distance: >3 FB Neck ROM: Full    Dental  (+) Teeth Intact   Pulmonary asthma , sleep apnea and Continuous Positive Airway Pressure Ventilation , COPD, former smoker,    breath sounds clear to auscultation       Cardiovascular hypertension, Pt. on medications  Rhythm:Regular Rate:Normal     Neuro/Psych PSYCHIATRIC DISORDERS Depression    GI/Hepatic hiatal hernia, GERD  Medicated and Controlled,  Endo/Other  Morbid obesity  Renal/GU      Musculoskeletal   Abdominal   Peds  Hematology   Anesthesia Other Findings   Reproductive/Obstetrics                             Anesthesia Physical Anesthesia Plan  ASA: III  Anesthesia Plan: MAC   Post-op Pain Management:    Induction: Intravenous  Airway Management Planned: Simple Face Mask  Additional Equipment:   Intra-op Plan:   Post-operative Plan:   Informed Consent: I have reviewed the patients History and Physical, chart, labs and discussed the procedure including the risks, benefits and alternatives for the proposed anesthesia with the patient or authorized representative who has indicated his/her understanding and acceptance.     Plan Discussed with:   Anesthesia Plan Comments:         Anesthesia Quick Evaluation

## 2016-04-23 NOTE — H&P (View-Only) (Signed)
Subjective:    Patient ID: [name] [name], female    DOB: [birth year], 56 y.o.   MRN: [id number]  Moshe Cipro, MD  HPI Been having trouble with abdominal pain. WAS TRANSFERRED TO [facility]. 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 LEFT COLECTOMY COMPLICATED BY ANASTOMOTIC LEAK/INTRAPERITONEAL ABSCESS REQUIRING  DRAINAGE AND C DIFF x 4. SEEN BY DR. Shearon Stalls. LAST VISIT WAS FEB 2017. HAS HAD CHRONIC ABDOMINAL PAIN SINCE SURGERY BUT IT GOT WORSE OVER SEVERAL MOS/FEVER JAN 2018 AND PT ADMITTED WITH LARGE ABSCESS(IP) ON LEFT SIDE. ABX FOR 3 WEEKS AND DRAINED. PAIN BETTER BUT NOT RESOLVED. COULDN'T HARDLY WALK. DRAIN PULLED ONE MO AGO. STAYS NAUSEATED. STILL HAS COLOSTOMY AND IT'S WATERY STOOLS. HAS ISSUES WITH DOESN'T FEEL LIKE SHE CAN BREATHE GOOD. NO ENERGY. HARDLY EVER SOFT/FORMED STOOL. REFLUX CONTROLLED.ALL OVER ABDOMINAL PAIN(CRAMPIN, OFF AND ON) AND KNIFE STABS ON SIDES. DOESN'T KEEP HER AWAKE. WEIGHT: USU. 260 LBS AND WAS 277 LBS.  PT DENIES FEVER, CHILLS, HEMATOCHEZIA, HEMATEMESIS, vomiting, melena, CHEST PAIN, CHANGE IN BOWEL IN HABITS, constipation, problems swallowing, problems with sedation, OR heartburn or indigestion.   Past Medical History:  Diagnosis Date  . Anemia   . Arthritis    "shoulders, ankles, back" (02/23/2016)  . Asthma   . COPD (chronic obstructive pulmonary disease) ([place])   . Depression   . Diarrhea age 40  . Diverticulitis SEP 2015   PERFORATED, SIGMOID COLON  . GERD (gastroesophageal reflux disease)   . Heart murmur   . History of hiatal hernia   . HTN (hypertension)   . Hyperlipidemia   . Kidney disease    "had kidney disease when I was a child"  . OSA on CPAP 0737   nasal; uncertain setting  . Restless legs syndrome    Past Surgical History:  Procedure Laterality Date  . ABSCESS DRAINAGE  ~ 02/20/2016   into my stomach; radiology  . ANTERIOR AND POSTERIOR REPAIR     "w/my bladder tack"  . CERVICAL CONE BIOPSY    . COLONOSCOPY N/A 04/05/2014   TGG:YIRSW HH/moderate  diverticulosis  . 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 ESOPHAGOGASTRODUODENOSCOPY N/A 04/05/2014   NIO:[id number] gastric polyps/mild erosive gastritis  . INCONTINENCE SURGERY     bladder tack  . IR GENERIC HISTORICAL  03/07/2016   IR RADIOLOGIST EVAL & MGMT 03/07/2016 [facility] INTERV RAD  . TUBAL LIGATION     Allergies  Allergen Reactions  . Penicillins Shortness Of Breath    Tolerates Zosyn  . Cortisone Hives  . Crestor [Rosuvastatin Calcium] Other (See Comments)    "started messing up my liver"  . Dilaudid [Hydromorphone Hcl] Other (See Comments)    Respiratory depression  . Neosporin [Neomycin-Bacitracin Zn-Polymyx] Hives  . Vancomycin Nausea And Vomiting    And Red Man's Syndrome   Current Outpatient Prescriptions  Medication Sig Dispense Refill  . albuterol (PROVENTIL HFA;VENTOLIN HFA) 108 (90 BASE) MCG/ACT inhaler Inhale into the lungs every 6 (six) hours as needed for wheezing or shortness of breath.    Marland Kitchen atorvastatin (LIPITOR) 10 MG tablet Take 10 mg by mouth at bedtime.     . Calcium Carbonate-Vitamin D 600-400 MG-UNIT tablet Take 1 tablet by mouth daily.     . Fluticasone-Salmeterol (ADVAIR) 250-50 MCG/DOSE AEPB Inhale 1 puff into the lungs 2 (two) times daily.    Marland Kitchen losartan (COZAAR) 50 MG tablet Take 50 mg by mouth daily.    . nebivolol (BYSTOLIC) 5 MG tablet Take 5 mg by mouth daily.    Marland Kitchen  ondansetron (ZOFRAN) 4 MG tablet Take 1 tablet (4 mg total) by mouth every 6 (six) hours as needed for nausea. 1X/DAY   . oxyCODONE-acetaminophen (PERCOCET/ROXICET) 5-325 MG tablet Take 1 tablet by mouth every 6 (six) hours as needed for moderate pain. NONE   . pantoprazole (PROTONIX) 40 MG tablet Take 40 mg by mouth daily.    . polyethylene glycol powder (GLYCOLAX/MIRALAX) powder MIX 1 HEAPING TABLESPOONFUL IN 8 OUNCES OF A LIQUID AND DRINK BID PRF CONSTIPATION NONE   . pramipexole (MIRAPEX) 0.5 MG tablet Take 0.25 mg by mouth at bedtime.     . pramipexole (MIRAPEX) 0.5 MG tablet Take  0.5 mg by mouth at bedtime.     .      . venlafaxine XR (EFFEXOR-XR) 150 MG 24 hr capsule Take 150 mg by mouth daily with breakfast.     Review of Systems PER HPI OTHERWISE ALL SYSTEMS ARE NEGATIVE.    Objective:   Physical Exam  Constitutional: She is oriented to person, place, and time. She appears well-developed and well-nourished. No distress.  HENT:  Head: Normocephalic and atraumatic.  Mouth/Throat: Oropharynx is clear and moist. No oropharyngeal exudate.  Eyes: Pupils are equal, round, and reactive to light. No scleral icterus.  Neck: Normal range of motion. Neck supple.  Cardiovascular: Normal rate, regular rhythm and normal heart sounds.   Pulmonary/Chest: Effort normal and breath sounds normal. No respiratory distress.  Abdominal: Soft. Bowel sounds are normal. She exhibits no distension. There is no tenderness.  LUQ COLOSTOMY  Musculoskeletal: She exhibits no edema.  Lymphadenopathy:    She has no cervical adenopathy.  Neurological: She is alert and oriented to person, place, and time.  NO FOCAL DEFICITS  Psychiatric:  SLIGHTLY ANXIOUS MOOD, NL AFFECT, TEARFUL AT TIMES  Vitals reviewed.     Assessment & Plan:

## 2016-04-23 NOTE — Op Note (Signed)
[facility] Patient Name: [name] [name] Procedure Date: 04/23/2016 11:46 AM MRN: [id number] Date of Birth: Jun 04, 1960 Attending MD: Barney Drain , MD CSN: [id number] Age: 56 Admit Type: Outpatient Procedure:                Colonoscopy with COLD SNARE POLYPECTOMY Indications:              Abdominal pain in the right lower quadrant-RECENTLY                            TREATED FOR LARGE PELVIC ABSCESS THAT WAS MOST                            LIKEY DUE TO COMPLICATED DIVERTICULITIS. PT USES                            NAPROXEN. Providers:                Barney Drain, MD, Jeanann Lewandowsky. Sharon Seller, RN, Aram Candela Referring MD:             Moshe Cipro Medicines:                Propofol per Anesthesia Complications:            No immediate complications. Estimated Blood Loss:     Estimated blood loss was minimal. Procedure:                Pre-Anesthesia Assessment:                           - Prior to the procedure, a History and Physical                            was performed, and patient medications and                            allergies were reviewed. The patient's tolerance of                            previous anesthesia was also reviewed. The risks                            and benefits of the procedure and the sedation                            options and risks were discussed with the patient.                            All questions were answered, and informed consent                            was obtained. Prior Anticoagulants: The patient has  taken naproxen. ASA Grade Assessment: II - A                            patient with mild systemic disease. After reviewing                            the risks and benefits, the patient was deemed in                            satisfactory condition to undergo the procedure.                            After obtaining informed consent, the colonoscope                            was  passed under direct vision. Throughout the                            procedure, the patient's blood pressure, pulse, and                            oxygen saturations were monitored continuously. The                            Colonoscope was introduced through the anus and                            advanced to the the surgical stoma. The SAME scope                            WAS WITHDRAWN AND was introduced through the                            transverse colostomy and advanced to the ILEUM. The                            colonoscopy was technically difficult and complex                            due to a tortuous colon and the patient's body                            habitus. Successful completion of the procedure was                            aided by decreasing the dose of sedation                            medication, lavage and receiving assistance from                            additional staff. The patient tolerated the  procedure fairly well. The quality of the bowel                            preparation was good. The ileocecal valve,                            appendiceal orifice, and rectum were photographed. Scope In: 12:26:40 PM Scope Out: 12:55:22 PM Scope Withdrawal Time: 0 hours 16 minutes 37 seconds  Total Procedure Duration: 0 hours 28 minutes 42 seconds  Findings:      A few small and large-mouthed diverticula were found in the       recto-sigmoid colon IN PROXIMAL COLORECTAL STUMP. ITHERWISE NORMAL       REMNANT. NO DIVERTICULA SEEN IN TEH RECTUM. THE COLORECTAL REMNANT IS       APPROXIMATELY 30 CM IN LENGTH.      SMALL Internal hemorrhoids were found during retroflexion. The       hemorrhoids were small.      A 6 mm polyp was found in the appendiceal orifice. The polyp was       sessile. The polyp was removed with a cold snare. Resection and       retrieval were complete. Impression:               - Diverticulosis in the PROXIMAL  recto-sigmoid                            colon.                           - Internal hemorrhoids.                           - One 6 mm polyp at the appendiceal orifice,                            removed with a cold snare. Moderate Sedation:      Per Anesthesia Care Recommendation:           - Await pathology results.                           - Repeat colonoscopy in 5-10 years for surveillance.                           - Resume previous diet. CONSIDER BENEFITS V. RISKS                            OF CONTINUED USE OF NSAIDS AND ADDING TENUATE TO                            ASSIST WITH WEIGHT LOSS.                           - Continue present medications.                           - Return to my office in 4 months. CONSIDER TAKE  DOWN OF OSTOMY AND RESECTION OF REMAINING SEGMENT                            THAT CONTAINS DIVERTICULA.                           - Patient has a contact number available for                            emergencies. The signs and symptoms of potential                            delayed complications were discussed with the                            patient. Return to normal activities tomorrow.                            Written discharge instructions were provided to the                            patient. Procedure Code(s):        --- Professional ---                           [phone], Colonoscopy through stoma; with removal of                            tumor(s), polyp(s), or other lesion(s) by snare                            technique Diagnosis Code(s):        --- Professional ---                           K64.8, Other hemorrhoids                           D12.1, Benign neoplasm of appendix                           R10.31, Right lower quadrant pain                           K57.30, Diverticulosis of large intestine without                            perforation or abscess without bleeding CPT copyright 2016 American Medical  Association. All rights reserved. The codes documented in this report are preliminary and upon coder review may  be revised to meet current compliance requirements. Barney Drain, MD Barney Drain, MD 04/23/2016 3:26:28 PM This report has been signed electronically. Number of Addenda: 0

## 2016-04-30 ENCOUNTER — Encounter: Payer: Self-pay | Admitting: Gastroenterology

## 2016-04-30 NOTE — Progress Notes (Signed)
Mailing a letter for pt to call.  

## 2016-04-30 NOTE — Progress Notes (Signed)
Tried to call pt and Vm not set up.  

## 2016-04-30 NOTE — Progress Notes (Signed)
Pt is aware.  

## 2016-05-06 ENCOUNTER — Encounter (HOSPITAL_COMMUNITY): Payer: Self-pay | Admitting: Gastroenterology

## 2016-06-05 ENCOUNTER — Ambulatory Visit: Payer: BLUE CROSS/BLUE SHIELD | Admitting: Gastroenterology

## 2016-06-26 ENCOUNTER — Ambulatory Visit: Payer: BLUE CROSS/BLUE SHIELD | Admitting: Gastroenterology

## 2016-07-31 ENCOUNTER — Encounter: Payer: Self-pay | Admitting: Gastroenterology

## 2016-12-12 ENCOUNTER — Ambulatory Visit: Payer: BLUE CROSS/BLUE SHIELD | Admitting: Gastroenterology

## 2016-12-12 ENCOUNTER — Encounter: Payer: Self-pay | Admitting: Gastroenterology

## 2016-12-12 ENCOUNTER — Other Ambulatory Visit: Payer: Self-pay | Admitting: *Deleted

## 2016-12-12 DIAGNOSIS — K5732 Diverticulitis of large intestine without perforation or abscess without bleeding: Secondary | ICD-10-CM

## 2016-12-12 DIAGNOSIS — M25561 Pain in right knee: Secondary | ICD-10-CM

## 2016-12-12 DIAGNOSIS — G8929 Other chronic pain: Secondary | ICD-10-CM

## 2016-12-12 DIAGNOSIS — M1711 Unilateral primary osteoarthritis, right knee: Secondary | ICD-10-CM

## 2016-12-12 MED ORDER — MELOXICAM 7.5 MG PO TABS: ORAL_TABLET | ORAL | 0 refills | Status: DC

## 2016-12-12 MED ORDER — OXYCODONE-ACETAMINOPHEN 5-325 MG PO TABS: 1.0000 | ORAL_TABLET | Freq: Four times a day (QID) | ORAL | 0 refills | Status: DC | PRN

## 2016-12-12 MED ORDER — DIETHYLPROPION HCL 25 MG PO TABS: ORAL_TABLET | ORAL | 0 refills | Status: DC

## 2016-12-12 NOTE — Patient Instructions (Addendum)
I COULD NOT GIVE YOU ULTRAM BECAUSE YOU TAKE EFFEXOR. I GAVE YOU A ONE TIME PRESCRIPTION FOR PERCOCET.  TAKE MOBIC ONCE DAILY FOR 10 DAYS. USE PERCOET IF NEEDED FOR ADDITIONAL PAIN RELIEF.  AVOID IBUPROFEN(MOTRIN) AND NAPROXEN(ALEVE).  I AM REFERRING YOU TO THE PAIN CLINIC WITH DR. Merlene Laughter AND TO ORTHOPEDICS FOR KNEE PAIN.   I AM REFERRING YOU TO NUTRITION FOR WEIGHT LOSS/DEIT EDUCATION.  TO HELP WITH WEIGHT LOSS TAKE TENUATE  ONCE OR TWICE A DAY. LET ME KNOW IF YOU WOULD LIKE A REFERRAL TO THE BARIATRIC CLINIC.  CHECK YOUR BLOOD PRESSURE DAILY WHILE TAKING TENUATE. SEE INFO ON SIDE EFFECTS BELOW.   PLEASE CALL WITH QUESTIONS OR CONCERNS.  FOLLOW UP IN 6 WEEKS.  Tenuate side effects Get emergency medical help if you have any of these signs of an allergic reaction to Tenuate: hives; difficult breathing; swelling of your face, lips, tongue, or throat.  Stop using Tenuate and call your doctor at once if you have a serious side effect such as: fast, pounding, or uneven heartbeats; chest pain, feeling short of breath (even with mild exertion); feeling like you might pass out; swelling in your ankles or feet; confusion, hallucinations, unusual thoughts or behavior; seizure (convulsions); muscle movements you cannot control; or sudden numbness or weakness, especially on one side of the body. Less serious Tenuate side effects may include: nausea, vomiting, diarrhea, upset stomach; headache, blurred vision; feeling nervous, anxious, or jittery; sleep problems (insomnia); dizziness, drowsiness, tired feeling; depressed mood; dry mouth, unpleasant taste in your mouth; decreased sex drive; or mild itching or rash.

## 2016-12-12 NOTE — Assessment & Plan Note (Addendum)
PT HURTS ALL THE TIME AND NOW HAVING R KNEE PAIN THAT IS NOT CONTROLLED. WEIGHT UP 11 LBS SINCE 2016.  TAKE MOBIC ONCE DAILY FOR 10 DAYS. USE PERCOET IF NEEDED FOR ADDITIONAL PAIN RELIEF. AVOID IBUPROFEN(MOTRIN) AND NAPROXEN(ALEVE). REFERRING TO THE PAIN CLINIC WITH DR. Merlene Laughter AND TO ORTHOPEDICS FOR KNEE PAIN. REFERRING TO NUTRITION FOR WEIGHT LOSS/DEIT EDUCATION. TO HELP WITH WEIGHT LOSS TAKE TENUATE  ONCE OR TWICE A DAY. LET ME KNOW IF YOU WOULD LIKE A REFERRAL TO THE BARIATRIC CLINIC. CHECK YOUR BLOOD PRESSURE DAILY WHILE TAKING TENUATE.  HANDOUT GIVEN ON SIDE EFFECTS BELOW. FOLLOW UP IN 6 WEEKS.

## 2016-12-12 NOTE — Assessment & Plan Note (Signed)
NO RECENT FLARES. CONTINUES WITH INTERMITTENT RLQ ABDOMINAL PAIN.  OK TO TAKE MOBIC ONCE DAILY FOR 10 DAYS. USE PERCOET IF NEEDED FOR ADDITIONAL PAIN RELIEF. AVOID IBUPROFEN(MOTRIN) AND NAPROXEN(ALEVE). CONTINUE TO MONITOR SYMPTOMS.

## 2016-12-12 NOTE — Addendum Note (Signed)
Addended by: Danie Binder on: 12/12/2016 11:52 AM   Modules accepted: Orders

## 2016-12-12 NOTE — Progress Notes (Signed)
Subjective:    Patient ID: [name] [name], female    DOB: April 11, 1960, 56 y.o.   MRN: [id number]  Moshe Cipro, MD  HPI Had stress test. HURTS ALL OVER. CT CHEST: NO CLOT BUT COPD. HURTING IN LOWER RIGHT ABDOMEN(EVERY DAY, COMES AND GOES, LASTS FEW MINS, SHARP ON SOME DAYS THAN OTHER, SAME SPOT, NO RADIATION). GETS DIARRHEA OFF AND ON. TAKES IMDOIUM OFF AND ON AND HELPS SOME. NAUSEA: PRETTY MUCH EVERY DAY. BMS: CHANGES BAG 3X/DAY(MOSTLY SOFT). SOB: WHEN WALKING AROUND. FEELS LIKE PASSING OUT SOMETIMES. HEARTBURN FAIRLY WELL CONTROLLED. SCARES HER THAT ABSCESS WILL REOCCUR. CAN BE SO SEVER IT WILL TAKE YOUR BREATH AWAY. TAKING ASA DAILY FOR HER HEART. TAKING ALEVE AND MOTRIN BUT NOT TRIED ANYTHING ELSE.  PT DENIES FEVER, CHILLS, HEMATOCHEZIA, HEMATEMESIS, vomiting, melena, diarrhea, CHEST PAIN, CHANGE IN BOWEL IN HABITS, constipation, problems swallowing, problems with sedation, OR heartburn or indigestion.   Past Medical History:  Diagnosis Date  . Anemia   . Arthritis    "shoulders, ankles, back" (02/23/2016)  . Asthma   . COPD (chronic obstructive pulmonary disease) ([place])   . Depression   . Diarrhea age 65  . Diverticulitis SEP 2015   PERFORATED, SIGMOID COLON  . GERD (gastroesophageal reflux disease)   . Heart murmur   . History of hiatal hernia   . HTN (hypertension)   . Hyperlipidemia   . Kidney disease    "had kidney disease when I was a child"  . OSA on CPAP 1779   nasal; uncertain setting  . Restless legs syndrome     Past Surgical History:  Procedure Laterality Date  . ABSCESS DRAINAGE  ~ 02/20/2016   into my stomach; radiology  . ANTERIOR AND POSTERIOR REPAIR     "w/my bladder tack"  . CERVICAL CONE BIOPSY    . 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 INCONTINENCE SURGERY     bladder tack  . IR GENERIC HISTORICAL  03/07/2016   IR RADIOLOGIST EVAL & MGMT 03/07/2016 [facility] INTERV RAD  . TUBAL LIGATION     Allergies  Allergen Reactions  . Penicillins Shortness  Of Breath      . Sulfa Antibiotics Anaphylaxis    THROAT CLOSING UP  . Cortisone Hives  . Crestor [Rosuvastatin Calcium] Other (See Comments)    Liver toxicity  . Dilaudid [Hydromorphone Hcl] Other (See Comments)    Respiratory depression  . Neosporin [Neomycin-Bacitracin Zn-Polymyx] Hives  . Vancomycin Nausea And Vomiting    And Red Man's Syndrome   Current Outpatient Medications  Medication Sig Dispense Refill  . albuterol (PROVENTIL HFA;VENTOLIN HFA) 108 (90 BASE) MCG/ACT inhaler Inhale into the lungs every 6 (six) hours as needed for wheezing or shortness of breath.    Marland Kitchen atorvastatin (LIPITOR) 10 MG tablet Take 10 mg by mouth daily.     Marland Kitchen CALCIUM-VITAMIN D PO Take 1 tablet by mouth daily.    Marland Kitchen dicyclomine (BENTYL) 20 MG tablet Take 20 mg by mouth 4 (four) times daily as needed. For abdominal pain/cramping    . Fluticasone-Salmeterol (ADVAIR) 250-50 MCG/DOSE AEPB Inhale 1 puff into the lungs 2 (two) times daily.    Marland Kitchen losartan (COZAAR) 50 MG tablet Take 50 mg by mouth daily.    . magnesium oxide (MAG-OX) 400 MG tablet Take 400 mg by mouth daily.    .      . nebivolol (BYSTOLIC) 5 MG tablet Take 5 mg by mouth daily.    . ondansetron (ZOFRAN) 4  MG tablet Take 1 tablet (4 mg total) by mouth every 6 (six) hours as needed for nausea.    Marland Kitchen oxyCODONE-acetaminophen (PERCOCET/ROXICET) 5-325 MG tablet Take 1 tablet by mouth every 6 (six) hours as needed for moderate pain.    . pantoprazole (PROTONIX) 40 MG tablet Take 40 mg by mouth daily.    . Polyvinyl Alcohol (LUBRICANT DROPS OP) Place 1-2 drops into both eyes 3 (three) times daily as needed (for dry eyes.).    Marland Kitchen pramipexole (MIRAPEX) 0.5 MG tablet Take 0.5 mg by mouth at bedtime.     Marland Kitchen venlafaxine XR (EFFEXOR-XR) 150 MG 24 hr capsule Take 150 mg by mouth daily with breakfast.    . Vitamin D, Ergocalciferol, (DRISDOL) 50000 units CAPS capsule Take 50,000 Units by mouth every Thursday.  3   Review of Systems PER HPI OTHERWISE ALL SYSTEMS  ARE NEGATIVE.    Objective:   Physical Exam  Constitutional: She is oriented to person, place, and time. She appears well-developed and well-nourished. No distress.  HENT:  Head: Normocephalic and atraumatic.  Mouth/Throat: Oropharynx is clear and moist. No oropharyngeal exudate.  Eyes: Pupils are equal, round, and reactive to light. No scleral icterus.  Neck: Normal range of motion. Neck supple.  Cardiovascular: Normal rate, regular rhythm and normal heart sounds.  Pulmonary/Chest: Effort normal and breath sounds normal. No respiratory distress.  Abdominal: Soft. Bowel sounds are normal. She exhibits no distension. There is no tenderness.  Obese, colostomy in LLQ  Musculoskeletal: She exhibits no edema.  Walks with a limp  Lymphadenopathy:    She has no cervical adenopathy.  Neurological: She is alert and oriented to person, place, and time.  NO  NEW FOCAL DEFICITS  Psychiatric:  ANXIOUS MOOD, flat AFFECT   Vitals reviewed.     Assessment & Plan:

## 2016-12-12 NOTE — Progress Notes (Signed)
CC'ED TO PCP 

## 2016-12-12 NOTE — Progress Notes (Signed)
PATIENT SCHEDULED  °

## 2016-12-16 ENCOUNTER — Encounter: Payer: Self-pay | Admitting: Orthopedic Surgery

## 2016-12-16 ENCOUNTER — Telehealth: Payer: Self-pay | Admitting: Gastroenterology

## 2016-12-16 NOTE — Telephone Encounter (Signed)
Pt called to reschedule with SF and is aware of OV on 1/2 at 2. She also has questions about meloxicam that she was taking. She said it was causing her to feel heavy in her chest and having shortness of breath. She also was asking about taking something for pain. Please advise and call her at [phone]

## 2016-12-17 NOTE — Telephone Encounter (Signed)
Pt said she finally stopped taking the Meloxicam, she kept feeling heaviness in her chest and some shortness of breath when she took it.  She is still having a lot of knee, shoulder and hand pain, but has been referred to orthopedic for 12/31/2016.  Still has some abdominal cramps at times.   She just does not know what to do for her pain until then, she cannot take Ibuprofen and Aleve.  She said she had a medication that she described like a prednisone dose pak, where you take so many pills a day and it decreases. She said that helped her more than anything she has ever had.  She just wants to know what she should do til her appointment.  Please advise!

## 2016-12-18 ENCOUNTER — Encounter: Payer: Self-pay | Admitting: Gastroenterology

## 2016-12-18 NOTE — Telephone Encounter (Signed)
Patient called back to reschedule her appointment from 1/2 to 1/17.  She would also like recommendations in regards to her abd pain.

## 2016-12-18 NOTE — Telephone Encounter (Signed)
Patient stated she will take some pain meds for today to help relieve her pain.

## 2016-12-18 NOTE — Telephone Encounter (Signed)
PLEASE CALL PT. I DO NOT HAVE ANY OTHER MEDICATION OPTIONS FOR HER PAIN MANAGEMENT BESIDES TYLENOL OR PERCOCET. SHE WAS GIVEN A ONE TIME PRESCRIPTION FOR PERCOCET. IF MOBIC CAUSES CHEST HEAVINESS THEN SHE SHOULD NOT TAKE IT ANYMORE. SHE SHOULD SEE ORTHO AND NEUROLOGY FOR ADDITIONAL PAIN MANAGEMENT OPTIONS.

## 2016-12-19 ENCOUNTER — Other Ambulatory Visit: Payer: Self-pay | Admitting: Gastroenterology

## 2016-12-19 NOTE — Telephone Encounter (Signed)
Pt called and is aware of the info.

## 2016-12-19 NOTE — Telephone Encounter (Signed)
Tried to call and VM not set up. Mailing a letter with the info.

## 2016-12-31 ENCOUNTER — Ambulatory Visit: Payer: BLUE CROSS/BLUE SHIELD | Admitting: Orthopaedic Surgery

## 2016-12-31 ENCOUNTER — Ambulatory Visit (INDEPENDENT_AMBULATORY_CARE_PROVIDER_SITE_OTHER): Payer: BLUE CROSS/BLUE SHIELD

## 2016-12-31 ENCOUNTER — Encounter: Payer: Self-pay | Admitting: Orthopaedic Surgery

## 2016-12-31 ENCOUNTER — Telehealth: Payer: Self-pay | Admitting: *Deleted

## 2016-12-31 VITALS — BP 130/84 | HR 118 | Temp 97.7°F | Ht 61.0 in | Wt 285.0 lb

## 2016-12-31 DIAGNOSIS — M25561 Pain in right knee: Secondary | ICD-10-CM

## 2016-12-31 DIAGNOSIS — G8929 Other chronic pain: Secondary | ICD-10-CM

## 2016-12-31 NOTE — Telephone Encounter (Signed)
Received call from [facility] with Dr. Freddie Apley office and was advised they are unable to accept patient for pain management. Per [employer] patient lives in [place], [place] and it would be hard to manage since she is out of state. Please advise Dr. Oneida Alar thanks

## 2016-12-31 NOTE — Progress Notes (Signed)
Subjective:    Patient ID: [name] [name], female    DOB: 1960-11-26, 56 y.o.   MRN: [id number]  HPI  She has right knee pain, more acute over the last month to six weeks, but has had chronic pain for a while.  She saw Dr. Oneida Alar recently for GI problems and is referred here.  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  She has had multiple arthralgias but more pain in the right knee. She has swelling, popping and feeling it will give way but no actual giving way.  She cannot take NSAIDs.  Tylenol does not help.  She has been on a trial of Mobic but could not tolerate it.  She has been on Oxycodene which helped.  She is to see a pain management doctor.  She has no trauma.  She has no redness or numbness.    Review of Systems  HENT: Negative for congestion.   Respiratory: Positive for shortness of breath. Negative for cough.   Cardiovascular: Negative for chest pain and leg swelling.  Endocrine: Positive for cold intolerance.  Musculoskeletal: Positive for arthralgias, gait problem and joint swelling.  Allergic/Immunologic: Positive for environmental allergies.   Past Medical History:  Diagnosis Date  . Anemia   . Arthritis    "shoulders, ankles, back" (02/23/2016)  . Asthma   . COPD (chronic obstructive pulmonary disease) ([place])   . Depression   . Diarrhea age 26  . Diverticulitis SEP 2015   PERFORATED, SIGMOID COLON  . GERD (gastroesophageal reflux disease)   . Heart murmur   . History of hiatal hernia   . HTN (hypertension)   . Hyperlipidemia   . Kidney disease    "had kidney disease when I was a child"  . OSA on CPAP 3825   nasal; uncertain setting  . Restless legs syndrome     Past Surgical History:  Procedure Laterality Date  . ABSCESS DRAINAGE  ~ 02/20/2016   into my stomach; radiology  . ANTERIOR AND POSTERIOR REPAIR     "w/my bladder tack"  . CERVICAL CONE BIOPSY    .  COLONOSCOPY N/A 04/05/2014   [id number] HH/moderate diverticulosis  . COLONOSCOPY WITH PROPOFOL N/A 04/23/2016   Procedure: COLONOSCOPY WITH PROPOFOL;  Surgeon: Danie Binder, MD;  [facility]: [facility];  Service: Endoscopy;  Laterality: N/A;  1030  . 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 ESOPHAGOGASTRODUODENOSCOPY N/A 04/05/2014   ALP:[id number] gastric polyps/mild erosive gastritis  . INCONTINENCE SURGERY     bladder tack  . IR GENERIC HISTORICAL  03/07/2016   IR RADIOLOGIST EVAL & MGMT 03/07/2016 [facility] INTERV RAD  . POLYPECTOMY  04/23/2016   Procedure: POLYPECTOMY;  Surgeon: Danie Binder, MD;  Location: [facility];  Service: Endoscopy;;  colon  . TUBAL LIGATION      Current Outpatient Medications on File Prior to Visit  Medication Sig Dispense Refill  . albuterol (PROVENTIL HFA;VENTOLIN HFA) 108 (90 BASE) MCG/ACT inhaler Inhale into the lungs every 6 (six) hours as needed for wheezing or shortness of breath.    Marland Kitchen atorvastatin (LIPITOR) 10 MG tablet Take 10 mg by mouth daily.     Marland Kitchen CALCIUM-VITAMIN D PO Take 1 tablet by mouth daily.    Marland Kitchen dicyclomine (BENTYL) 20 MG tablet Take 20 mg by mouth 4 (four) times daily as needed. For abdominal pain/cramping  0  . Diethylpropion HCl 25 MG TABS 1 po  1 hour prior to meals BID 60 each 0  . Fluticasone-Salmeterol (ADVAIR) 250-50 MCG/DOSE AEPB Inhale 1 puff into the lungs 2 (two) times daily.    Marland Kitchen losartan (COZAAR) 50 MG tablet Take 50 mg by mouth daily.    . magnesium oxide (MAG-OX) 400 MG tablet Take 400 mg by mouth daily.    . meloxicam (MOBIC) 7.5 MG tablet 1-2 PO DAILY TO PREVENT KNEE PAIN/SWELLING 20 tablet 0  . naproxen sodium (ANAPROX) 220 MG tablet Take 440 mg by mouth 2 (two) times daily as needed (for pain.).    Marland Kitchen nebivolol (BYSTOLIC) 5 MG tablet Take 5 mg by mouth daily.    . ondansetron (ZOFRAN) 4 MG tablet Take 1 tablet (4 mg total) by mouth every 6 (six) hours as needed for nausea. 65 tablet 0  . oxyCODONE-acetaminophen  (PERCOCET/ROXICET) 5-325 MG tablet Take 1 tablet every 6 (six) hours as needed by mouth for moderate pain. 15 tablet 0  . pantoprazole (PROTONIX) 40 MG tablet Take 40 mg by mouth daily.    . Polyvinyl Alcohol (LUBRICANT DROPS OP) Place 1-2 drops into both eyes 3 (three) times daily as needed (for dry eyes.).    Marland Kitchen pramipexole (MIRAPEX) 0.5 MG tablet Take 0.5 mg by mouth at bedtime.     Marland Kitchen venlafaxine XR (EFFEXOR-XR) 150 MG 24 hr capsule Take 150 mg by mouth daily with breakfast.    . Vitamin D, Ergocalciferol, (DRISDOL) 50000 units CAPS capsule Take 50,000 Units by mouth every Thursday.  3   No current facility-administered medications on file prior to visit.     Social History   Socioeconomic History  . Marital status: Married    Spouse name: Not on file  . Number of children: Not on file  . Years of education: Not on file  . Highest education level: Not on file  Social Needs  . Financial resource strain: Not on file  . Food insecurity - worry: Not on file  . Food insecurity - inability: Not on file  . Transportation needs - medical: Not on file  . Transportation needs - non-medical: Not on file  Occupational History  . Occupation: previously Personnel officer  Tobacco Use  . Smoking status: Former Smoker    Packs/day: 2.50    Years: 20.00    Pack years: 50.00    Types: Cigarettes    Last attempt to quit: 05/21/1997    Years since quitting: 19.6  . Smokeless tobacco: Never Used  Substance and Sexual Activity  . Alcohol use: No    Alcohol/week: 0.0 oz  . Drug use: No  . Sexual activity: Not Currently  Other Topics Concern  . Not on file  Social History Narrative   Mother passed 5 yrs ago.     Family History  Problem Relation Age of Onset  . Diverticulitis Mother   . Diverticulitis Maternal Aunt   . Diverticulitis Maternal Grandmother     BP 130/84   Pulse (!) 118   Temp 97.7 F (36.5 C)   Ht 5\' 1"  (1.549 m)   Wt 285 lb (129.3 kg)   LMP  (LMP Unknown)  Comment: menopausal  BMI 53.85 kg/m      Objective:   Physical Exam  Constitutional: She is oriented to person, place, and time. She appears well-developed and well-nourished.  HENT:  Head: Normocephalic and atraumatic.  Eyes: Conjunctivae and EOM are normal. Pupils are equal, round, and reactive to light.  Neck: Normal range of motion. Neck  supple.  Cardiovascular: Normal rate, regular rhythm and intact distal pulses.  Pulmonary/Chest: Effort normal.  Abdominal: Soft.  Musculoskeletal: Tenderness: Pain right knee, ROM 0 to 105, pain medially, positive medial [place], Limp right.  Left knee negative.  NV intact.  Neurological: She is alert and oriented to person, place, and time. She displays normal reflexes. No cranial nerve deficit. She exhibits normal muscle tone. Coordination normal.  Skin: Skin is warm and dry.  Psychiatric: She has a normal mood and affect. Her behavior is normal. Judgment and thought content normal.  Vitals reviewed.  X-rays were done of the right knee, reported separately.       Assessment & Plan:   Encounter Diagnosis  Name Primary?  . Chronic pain of right knee Yes   I will order PT for her.  I feel she will need MRI of the knee.  I will try the PT.  PROCEDURE NOTE:  The patient requests injections of the right knee , verbal consent was obtained.  The right knee was prepped appropriately after time out was performed.   Sterile technique was observed and injection of 1 cc of Depo-Medrol 40 mg with several cc's of plain xylocaine. Anesthesia was provided by ethyl chloride and a 20-gauge needle was used to inject the knee area. The injection was tolerated well.  A band aid dressing was applied.  The patient was advised to apply ice later today and tomorrow to the injection sight as needed.  Return to clinic in six weeks.  Call if any problem.  Precautions discussed.   Electronically Signed Sanjuana Kava, MD 11/27/20189:57 AM

## 2017-01-01 NOTE — Telephone Encounter (Signed)
Refer to pain management in [place].

## 2017-01-02 NOTE — Telephone Encounter (Signed)
Spoke with patient and she states she does not want to go to [facility] as they have limited parking spaces. She is going to look into who she wants [place] to refer her to in [place] and call us back once she decides. She reports she has a lot going on right now so it may be a little while before she call back.  I called [facility] and cancelled referral per pt request.

## 2017-01-02 NOTE — Telephone Encounter (Signed)
I have faxed referral to [facility] in [place]. Called pt, NA to make aware.

## 2017-01-06 ENCOUNTER — Telehealth: Payer: Self-pay | Admitting: Orthopaedic Surgery

## 2017-01-06 NOTE — Telephone Encounter (Signed)
I need to see tomorrow.  Go to ER if worse

## 2017-01-06 NOTE — Telephone Encounter (Signed)
I called [name] [name] back and she stated it was a little better but she wasn't walking on it.  I asked her to come in tomorrow and she said she couldn't because her husband had to work 12 hours and she couldn't or didn't drive herself.  I offered to have her come in on Wednesday but she states she is having a biopsy on that day and could not come. I then offered Thursday but she said she didn't think she would be able to come after having the biopsy.  I told her that if it started hurting worse again, Dr. Luna Glasgow recommended that she should  go to the ER or to call this office and someone would get her in to be seen.  She understood.  She said she would call us if need be.

## 2017-01-06 NOTE — Telephone Encounter (Signed)
Patient called stating she saw Dr. Luna Glasgow on Thursday, 12/31/16 and received an injection in her right knee.  She said that later that evening, she began to have tightness and some swelling in her calf muscle.  She said she cannot walk on this leg due to pain and is now using crutches.  She said she has put [facility] on it but this did not help any at all.  She has not tried to either put regular ice or heat on her calf.  She wants to know what she should do?  Please advise  Thanks

## 2017-01-16 NOTE — Addendum Note (Signed)
Addended by: Inge Rise on: 01/16/2017 04:10 PM   Modules accepted: Orders

## 2017-01-16 NOTE — Telephone Encounter (Signed)
Patient called back and wants to be referred to [facility] in [place], Dr. Sander Radon. Phone # [phone]. I have faxed referral to them at [phone].

## 2017-01-22 NOTE — Telephone Encounter (Signed)
Called [facility] Dr. Mancel Parsons office and was advised they are unable to accept her as a new patient. Did not specify why.   Called pt and made her aware. She asked that we try to get her in at [facility]. I will fax referral over to them.

## 2017-01-23 ENCOUNTER — Ambulatory Visit: Payer: BLUE CROSS/BLUE SHIELD | Admitting: Gastroenterology

## 2017-02-05 ENCOUNTER — Ambulatory Visit: Payer: BLUE CROSS/BLUE SHIELD | Admitting: Gastroenterology

## 2017-02-06 ENCOUNTER — Telehealth: Payer: Self-pay | Admitting: Gastroenterology

## 2017-02-06 NOTE — Telephone Encounter (Signed)
Called pt (see phone note that started 12/31/16).

## 2017-02-06 NOTE — Telephone Encounter (Signed)
Pt called office. She called [facility] and was told that her records must have been misplaced, they couldn't find them. They requested that her records be faxed to [phone] and they would have doctor review them tomorrow.   Referral records faxed to [phone].

## 2017-02-06 NOTE — Telephone Encounter (Signed)
[phone] patient called inquiring about her referral to a pain clinic

## 2017-02-06 NOTE — Telephone Encounter (Signed)
Pt called office today to inquire about her pain clinic referral. MS called [facility] Monday to f/u on referral and was told the doctor is still reviewing the referral. Called and informed pt. Gave her phone number to [facility] if needed. She asked if Dr. Oneida Alar could give her some more Oxycodone since it is taking so long for her to get in with pain management and she is still in pain.

## 2017-02-07 NOTE — Telephone Encounter (Signed)
PLEASE CALL PT. SHE WAS GIVEN A ONE TIME RX FOR PERCOCET. I CANNOT REFILL IT.

## 2017-02-10 ENCOUNTER — Telehealth: Payer: Self-pay | Admitting: Orthopaedic Surgery

## 2017-02-10 NOTE — Telephone Encounter (Signed)
Patient called and left a voicemail to cancel this appointment.  She states she will call us back.  She is having surgery

## 2017-02-10 NOTE — Telephone Encounter (Signed)
Patient called back and made aware of below.

## 2017-02-10 NOTE — Telephone Encounter (Signed)
Called [facility]. They received referral and it was put up for review 02/07/17. The pt will be contacted after the physician reviews the referral.

## 2017-02-10 NOTE — Telephone Encounter (Signed)
LMOVM

## 2017-02-11 ENCOUNTER — Ambulatory Visit: Payer: BLUE CROSS/BLUE SHIELD | Admitting: Nutrition

## 2017-02-11 ENCOUNTER — Ambulatory Visit: Payer: BLUE CROSS/BLUE SHIELD | Admitting: Orthopaedic Surgery

## 2017-02-12 ENCOUNTER — Ambulatory Visit: Payer: BLUE CROSS/BLUE SHIELD | Admitting: Gastroenterology

## 2017-02-12 NOTE — Telephone Encounter (Signed)
Called [facility]. Referral is still up for review by the doctor.

## 2017-02-20 ENCOUNTER — Ambulatory Visit: Payer: BLUE CROSS/BLUE SHIELD | Admitting: Gastroenterology

## 2017-02-24 NOTE — Telephone Encounter (Signed)
Called [facility] to f/u on referral. New patient info was mailed to pt 02/13/17 but they haven't received it back yet.

## 2017-02-26 NOTE — Telephone Encounter (Signed)
Received fax from [facility] re: pain management referral that states: after review our pain management doctors have nothing to offer this patient.

## 2017-02-27 NOTE — Telephone Encounter (Signed)
PLEASE CALL PT. LET HE KNOW THE PAIN CLINIC FEELS LIKE THEY HAVE NOTHING TO OFFER HER. SHE WILL NEED TO HAVE HER CHRONIC PAIN MANAGED BY HER PCP.

## 2017-02-27 NOTE — Telephone Encounter (Signed)
Tried to call pt, no answer, left detailed message on VM and informed her.

## 2017-09-01 ENCOUNTER — Other Ambulatory Visit: Payer: Self-pay | Admitting: Orthopedic Surgery

## 2017-09-01 DIAGNOSIS — M5416 Radiculopathy, lumbar region: Secondary | ICD-10-CM

## 2017-09-05 ENCOUNTER — Ambulatory Visit
Admission: RE | Admit: 2017-09-05 | Discharge: 2017-09-05 | Disposition: A | Discharge status: Home / Self Care | Payer: BLUE CROSS/BLUE SHIELD | Source: Ambulatory Visit | Attending: Orthopedic Surgery | Admitting: Orthopedic Surgery

## 2017-09-05 DIAGNOSIS — M5416 Radiculopathy, lumbar region: Secondary | ICD-10-CM

## 2017-12-02 ENCOUNTER — Emergency Department (HOSPITAL_COMMUNITY): Payer: BLUE CROSS/BLUE SHIELD

## 2017-12-02 ENCOUNTER — Encounter (HOSPITAL_COMMUNITY): Payer: Self-pay | Admitting: Emergency Medicine

## 2017-12-02 ENCOUNTER — Other Ambulatory Visit: Payer: Self-pay

## 2017-12-02 ENCOUNTER — Inpatient Hospital Stay (HOSPITAL_COMMUNITY)
Admission: EM | Admit: 2017-12-02 | Discharge: 2017-12-09 | DRG: 372 | Disposition: A | Discharge status: Home / Self Care | Payer: BLUE CROSS/BLUE SHIELD | Attending: Internal Medicine | Admitting: Internal Medicine

## 2017-12-02 DIAGNOSIS — Z881 Allergy status to other antibiotic agents status: Secondary | ICD-10-CM

## 2017-12-02 DIAGNOSIS — G4733 Obstructive sleep apnea (adult) (pediatric): Secondary | ICD-10-CM | POA: Diagnosis present

## 2017-12-02 DIAGNOSIS — Z79899 Other long term (current) drug therapy: Secondary | ICD-10-CM

## 2017-12-02 DIAGNOSIS — K219 Gastro-esophageal reflux disease without esophagitis: Secondary | ICD-10-CM | POA: Diagnosis present

## 2017-12-02 DIAGNOSIS — Z6841 Body Mass Index (BMI) 40.0 and over, adult: Secondary | ICD-10-CM | POA: Diagnosis not present

## 2017-12-02 DIAGNOSIS — J449 Chronic obstructive pulmonary disease, unspecified: Secondary | ICD-10-CM | POA: Diagnosis present

## 2017-12-02 DIAGNOSIS — I959 Hypotension, unspecified: Secondary | ICD-10-CM | POA: Diagnosis present

## 2017-12-02 DIAGNOSIS — D638 Anemia in other chronic diseases classified elsewhere: Secondary | ICD-10-CM | POA: Diagnosis present

## 2017-12-02 DIAGNOSIS — Z7951 Long term (current) use of inhaled steroids: Secondary | ICD-10-CM | POA: Diagnosis not present

## 2017-12-02 DIAGNOSIS — Z87891 Personal history of nicotine dependence: Secondary | ICD-10-CM | POA: Diagnosis not present

## 2017-12-02 DIAGNOSIS — G2581 Restless legs syndrome: Secondary | ICD-10-CM | POA: Diagnosis present

## 2017-12-02 DIAGNOSIS — I10 Essential (primary) hypertension: Secondary | ICD-10-CM | POA: Diagnosis present

## 2017-12-02 DIAGNOSIS — E785 Hyperlipidemia, unspecified: Secondary | ICD-10-CM | POA: Diagnosis present

## 2017-12-02 DIAGNOSIS — L02211 Cutaneous abscess of abdominal wall: Secondary | ICD-10-CM

## 2017-12-02 DIAGNOSIS — Z9989 Dependence on other enabling machines and devices: Secondary | ICD-10-CM | POA: Diagnosis not present

## 2017-12-02 DIAGNOSIS — E162 Hypoglycemia, unspecified: Secondary | ICD-10-CM | POA: Diagnosis not present

## 2017-12-02 DIAGNOSIS — F329 Major depressive disorder, single episode, unspecified: Secondary | ICD-10-CM | POA: Diagnosis present

## 2017-12-02 DIAGNOSIS — Z933 Colostomy status: Secondary | ICD-10-CM | POA: Diagnosis not present

## 2017-12-02 DIAGNOSIS — K651 Peritoneal abscess: Secondary | ICD-10-CM | POA: Diagnosis present

## 2017-12-02 HISTORY — DX: Cutaneous abscess of perineum: L02.215

## 2017-12-02 LAB — CBC WITH DIFFERENTIAL/PLATELET
ABS IMMATURE GRANULOCYTES: 0.04 10*3/uL (ref 0.00–0.07)
BASOS ABS: 0 10*3/uL (ref 0.0–0.1)
BASOS PCT: 0 %
EOS ABS: 0.1 10*3/uL (ref 0.0–0.5)
Eosinophils Relative: 1 %
HEMATOCRIT: 37.9 % (ref 36.0–46.0)
Hemoglobin: 11.9 g/dL — ABNORMAL LOW (ref 12.0–15.0)
IMMATURE GRANULOCYTES: 0 %
LYMPHS ABS: 1.3 10*3/uL (ref 0.7–4.0)
Lymphocytes Relative: 11 %
MCH: 26.7 pg (ref 26.0–34.0)
MCHC: 31.4 g/dL (ref 30.0–36.0)
MCV: 85 fL (ref 80.0–100.0)
MONOS PCT: 10 %
Monocytes Absolute: 1.3 10*3/uL — ABNORMAL HIGH (ref 0.1–1.0)
Neutro Abs: 9.6 10*3/uL — ABNORMAL HIGH (ref 1.7–7.7)
Neutrophils Relative %: 78 %
PLATELETS: 312 10*3/uL (ref 150–400)
RBC: 4.46 MIL/uL (ref 3.87–5.11)
RDW: 14.6 % (ref 11.5–15.5)
WBC: 12.4 10*3/uL — ABNORMAL HIGH (ref 4.0–10.5)
nRBC: 0 % (ref 0.0–0.2)

## 2017-12-02 LAB — COMPREHENSIVE METABOLIC PANEL
ALBUMIN: 3.2 g/dL — AB (ref 3.5–5.0)
ALT: 24 U/L (ref 0–44)
AST: 23 U/L (ref 15–41)
Alkaline Phosphatase: 123 U/L (ref 38–126)
Anion gap: 10 (ref 5–15)
BUN: 15 mg/dL (ref 6–20)
CHLORIDE: 102 mmol/L (ref 98–111)
CO2: 25 mmol/L (ref 22–32)
CREATININE: 0.82 mg/dL (ref 0.44–1.00)
Calcium: 9.3 mg/dL (ref 8.9–10.3)
GFR calc Af Amer: >60 mL/min (ref >60)
GLUCOSE: 124 mg/dL — AB (ref 70–99)
POTASSIUM: 4 mmol/L (ref 3.5–5.1)
Sodium: 137 mmol/L (ref 135–145)
Total Bilirubin: 1.4 mg/dL — ABNORMAL HIGH (ref 0.3–1.2)
Total Protein: 6.6 g/dL (ref 6.5–8.1)

## 2017-12-02 LAB — I-STAT CG4 LACTIC ACID, ED: Lactic Acid, Venous: 0.95 mmol/L (ref 0.5–1.9)

## 2017-12-02 MED ORDER — ONDANSETRON HCL 4 MG PO TABS: 4.0000 mg | ORAL_TABLET | Freq: Four times a day (QID) | ORAL | Status: DC | PRN

## 2017-12-02 MED ORDER — ACETAMINOPHEN 650 MG RE SUPP: 650.0000 mg | Freq: Four times a day (QID) | RECTAL | Status: DC | PRN

## 2017-12-02 MED ORDER — ACETAMINOPHEN 325 MG PO TABS: 650.0000 mg | ORAL_TABLET | Freq: Four times a day (QID) | ORAL | Status: DC | PRN

## 2017-12-02 MED ORDER — CLINDAMYCIN PHOSPHATE 600 MG/50ML IV SOLN
600.0000 mg | Freq: Once | INTRAVENOUS | Status: AC
  Administered 2017-12-02: 600 mg via INTRAVENOUS
  Filled 2017-12-02: qty 50

## 2017-12-02 MED ORDER — MORPHINE SULFATE (PF) 2 MG/ML IV SOLN
2.0000 mg | INTRAVENOUS | Status: DC | PRN
  Administered 2017-12-03: 4 mg via INTRAVENOUS
  Administered 2017-12-03 (×2): 2 mg via INTRAVENOUS
  Administered 2017-12-04 (×2): 4 mg via INTRAVENOUS
  Administered 2017-12-05: 2 mg via INTRAVENOUS
  Administered 2017-12-06 – 2017-12-07 (×3): 4 mg via INTRAVENOUS
  Administered 2017-12-07 – 2017-12-08 (×6): 2 mg via INTRAVENOUS
  Filled 2017-12-02 (×2): qty 1
  Filled 2017-12-02: qty 2
  Filled 2017-12-02: qty 1
  Filled 2017-12-02: qty 2
  Filled 2017-12-02 (×2): qty 1
  Filled 2017-12-02 (×3): qty 2
  Filled 2017-12-02: qty 1
  Filled 2017-12-02 (×2): qty 2
  Filled 2017-12-02 (×3): qty 1

## 2017-12-02 MED ORDER — IOHEXOL 300 MG/ML  SOLN
100.0000 mL | Freq: Once | INTRAMUSCULAR | Status: AC | PRN
  Administered 2017-12-02: 100 mL via INTRAVENOUS

## 2017-12-02 MED ORDER — PIPERACILLIN-TAZOBACTAM 3.375 G IVPB
3.3750 g | Freq: Three times a day (TID) | INTRAVENOUS | Status: DC
  Administered 2017-12-02 – 2017-12-04 (×6): 3.375 g via INTRAVENOUS
  Filled 2017-12-02 (×6): qty 50

## 2017-12-02 MED ORDER — SODIUM CHLORIDE 0.9 % IV BOLUS
1000.0000 mL | Freq: Once | INTRAVENOUS | Status: AC
  Administered 2017-12-02: 1000 mL via INTRAVENOUS

## 2017-12-02 MED ORDER — ONDANSETRON HCL 4 MG/2ML IJ SOLN
4.0000 mg | Freq: Four times a day (QID) | INTRAMUSCULAR | Status: DC | PRN
  Administered 2017-12-03 – 2017-12-07 (×4): 4 mg via INTRAVENOUS
  Filled 2017-12-02 (×4): qty 2

## 2017-12-02 MED ORDER — SODIUM CHLORIDE 0.9 % IV SOLN
100.0000 mg | Freq: Two times a day (BID) | INTRAVENOUS | Status: DC
  Filled 2017-12-02 (×2): qty 100

## 2017-12-02 MED ORDER — MORPHINE SULFATE (PF) 4 MG/ML IV SOLN
4.0000 mg | Freq: Once | INTRAVENOUS | Status: AC
Start: 2017-12-02 — End: 2017-12-02
  Administered 2017-12-02: 4 mg via INTRAVENOUS
  Filled 2017-12-02: qty 1

## 2017-12-02 MED ORDER — ONDANSETRON HCL 4 MG/2ML IJ SOLN
4.0000 mg | Freq: Once | INTRAMUSCULAR | Status: AC
  Administered 2017-12-02: 4 mg via INTRAVENOUS
  Filled 2017-12-02: qty 2

## 2017-12-02 NOTE — ED Provider Notes (Signed)
Patient placed in Quick Look pathway, seen and evaluated   Chief Complaint: LLQ pain and fevers  HPI: Patient with a history of sepsis secondary to perforated diverticulitis in 3/16 resulting in colostomy presents to the emergency department for evaluation of worsening left lower quadrant pain over the past 3 days.  Patient reports that she has severe, constant pain in the left lower quadrant which radiates to her back.  Had a fever of 101 F at home.  She is concerned about intra-abdominal infection.  She has associated nausea, no vomiting.  ROS:+ fever  Physical Exam:   Gen: No distress  Neuro: Awake and Alert  Skin: Warm    Focused Exam: Abdomen soft.  Large midline surgical scar which is well-healed.  Colostomy bag in left lower quadrant is empty.  She is acutely tender to palpation in the left lower quadrant.  No guarding or rigidity.  No rebound tenderness.   Initiation of care has begun. The patient has been counseled on the process, plan, and necessity for staying for the completion/evaluation, and the remainder of the medical screening examination    Bernarda Caffey 12/02/17 [place], MD 12/03/17 1606

## 2017-12-02 NOTE — H&P (Signed)
History and Physical    [name] [name] OHY:[id number] DOB: 05-14-1960 DOA: 12/02/2017  PCP: Moshe Cipro, MD  Patient coming from: Home  I have personally briefly reviewed patient's old medical records in [place]  Chief Complaint: Abd pain  HPI: [name] [name] is a 57 y.o. female with medical history significant of OSA, HTN, perforated diverticulitis s/p colostomy.  MRSA peritoneal abscess in Jan 2018 s/p IR drainage, Dapto during hospital stay and doxycycline post hospital stay.  Patient presents to the ED with c/o fevers, increasing LLQ abd pain for the past 3 days.  Nausea but no vomiting.  No diarrhea.  Pain in similar location to the 2018 abscess.   ED Course: CT confirms 1.4 x 2.9 x 5 cm abscess.  Patient started on zosyn in ED.   Review of Systems: As per HPI otherwise 10 point review of systems negative.   Past Medical History:  Diagnosis Date  . Anemia   . Arthritis    "shoulders, ankles, back" (02/23/2016)  . Asthma   . COPD (chronic obstructive pulmonary disease) ([place])   . Depression   . Diarrhea age 42  . Diverticulitis SEP 2015   PERFORATED, SIGMOID COLON  . GERD (gastroesophageal reflux disease)   . Heart murmur   . History of hiatal hernia   . HTN (hypertension)   . Hyperlipidemia   . Kidney disease    "had kidney disease when I was a child"  . OSA on CPAP 9485   nasal; uncertain setting  . Restless legs syndrome     Past Surgical History:  Procedure Laterality Date  . ABSCESS DRAINAGE  ~ 02/20/2016   into my stomach; radiology  . ANTERIOR AND POSTERIOR REPAIR     "w/my bladder tack"  . CERVICAL CONE BIOPSY    . COLONOSCOPY N/A 04/05/2014   IOE:VOJJK HH/moderate diverticulosis  . COLONOSCOPY WITH PROPOFOL N/A 04/23/2016   Procedure: COLONOSCOPY WITH PROPOFOL;  Surgeon: Danie Binder, MD;  Location: [facility];  Service: Endoscopy;  Laterality: N/A;  1030  . 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 ESOPHAGOGASTRODUODENOSCOPY N/A  04/05/2014   KXF:[id number] gastric polyps/mild erosive gastritis  . INCONTINENCE SURGERY     bladder tack  . IR GENERIC HISTORICAL  03/07/2016   IR RADIOLOGIST EVAL & MGMT 03/07/2016 [facility] INTERV RAD  . POLYPECTOMY  04/23/2016   Procedure: POLYPECTOMY;  Surgeon: Danie Binder, MD;  Location: [facility];  Service: Endoscopy;;  colon  . TUBAL LIGATION       reports that she quit smoking about 20 years ago. Her smoking use included cigarettes. She has a 50.00 pack-year smoking history. She has never used smokeless tobacco. She reports that she does not drink alcohol or use drugs.  Allergies  Allergen Reactions  . Penicillins Shortness Of Breath    Tolerates Zosyn Has patient had a PCN reaction causing immediate rash, facial/tongue/throat swelling, SOB or lightheadedness with hypotension: Yes Has patient had a PCN reaction causing severe rash involving mucus membranes or skin necrosis: No Has patient had a PCN reaction that required hospitalization: No  Has patient had a PCN reaction occurring within the last 10 years: No If all of the above answers are "NO", then may proceed with Cephalosporin use.  . Sulfa Antibiotics Anaphylaxis    THROAT CLOSING UP  . Cortisone Hives  . Crestor [Rosuvastatin Calcium] Other (See Comments)    Liver toxicity  . Dilaudid [Hydromorphone Hcl] Other (See Comments)  Respiratory depression  . Neosporin [Neomycin-Bacitracin Zn-Polymyx] Hives  . Vancomycin Nausea And Vomiting    And Red Man's Syndrome    Family History  Problem Relation Age of Onset  . Diverticulitis Mother   . Diverticulitis Maternal Aunt   . Diverticulitis Maternal Grandmother      Prior to Admission medications   Medication Sig Start Date End Date Taking? Authorizing Provider  albuterol (PROVENTIL HFA;VENTOLIN HFA) 108 (90 BASE) MCG/ACT inhaler Inhale into the lungs every 6 (six) hours as needed for wheezing or shortness of breath.    [provider]  atorvastatin  (LIPITOR) 10 MG tablet Take 10 mg by mouth daily.     [provider]  CALCIUM-VITAMIN D PO Take 1 tablet by mouth daily.    [provider]  dicyclomine (BENTYL) 20 MG tablet Take 20 mg by mouth 4 (four) times daily as needed. For abdominal pain/cramping 01/10/16   [provider]  Diethylpropion HCl 25 MG TABS 1 po 1 hour prior to meals BID 12/12/16   Fields, Sandi L, MD  Fluticasone-Salmeterol (ADVAIR) 250-50 MCG/DOSE AEPB Inhale 1 puff into the lungs 2 (two) times daily.    [provider]  losartan (COZAAR) 50 MG tablet Take 50 mg by mouth daily.    [provider]  magnesium oxide (MAG-OX) 400 MG tablet Take 400 mg by mouth daily.    [provider]  meloxicam (MOBIC) 7.5 MG tablet 1-2 PO DAILY TO PREVENT KNEE PAIN/SWELLING 12/12/16   Fields, Marga Melnick, MD  naproxen sodium (ANAPROX) 220 MG tablet Take 440 mg by mouth 2 (two) times daily as needed (for pain.).    [provider]  nebivolol (BYSTOLIC) 5 MG tablet Take 5 mg by mouth daily.    [provider]  ondansetron (ZOFRAN) 4 MG tablet Take 1 tablet (4 mg total) by mouth every 6 (six) hours as needed for nausea. 02/25/16   Theodis Blaze, MD  oxyCODONE-acetaminophen (PERCOCET/ROXICET) 5-325 MG tablet Take 1 tablet every 6 (six) hours as needed by mouth for moderate pain. 12/12/16   Fields, Marga Melnick, MD  pantoprazole (PROTONIX) 40 MG tablet Take 40 mg by mouth daily.    [provider]  Polyvinyl Alcohol (LUBRICANT DROPS OP) Place 1-2 drops into both eyes 3 (three) times daily as needed (for dry eyes.).    [provider]  pramipexole (MIRAPEX) 0.5 MG tablet Take 0.5 mg by mouth at bedtime.     [provider]  venlafaxine XR (EFFEXOR-XR) 150 MG 24 hr capsule Take 150 mg by mouth daily with breakfast.    [provider]  Vitamin D, Ergocalciferol, (DRISDOL) 50000 units CAPS capsule Take 50,000 Units by mouth every Thursday. 03/26/16   [provider]    Physical Exam: Vitals:   12/02/17 2115 12/02/17 2130 12/02/17 2145 12/02/17 2200  BP: 107/85 108/68 101/65 (!) 104/57  Pulse: 84 83 89 84  Resp: (!) 21 19 20  (!) 23  Temp:      TempSrc:      SpO2: 95% 92% 91% 95%  Weight:    132.5 kg  Height:    5' (1.524 m)    Constitutional: NAD, calm, comfortable Eyes: PERRL, lids and conjunctivae normal ENMT: Mucous membranes are moist. Posterior pharynx clear of any exudate or lesions.Normal dentition.  Neck: normal, supple, no masses, no thyromegaly Respiratory: clear to auscultation bilaterally, no wheezing, no crackles. Normal respiratory effort. No accessory muscle use.  Cardiovascular: Regular rate and rhythm,  no murmurs / rubs / gallops. No extremity edema. 2+ pedal pulses. No carotid bruits.  Abdomen: LLQ TTP and guarding. Musculoskeletal: no clubbing / cyanosis. No joint deformity upper and lower extremities. Good ROM, no contractures. Normal muscle tone.  Skin: no rashes, lesions, ulcers. No induration Neurologic: CN 2-12 grossly intact. Sensation intact, DTR normal. Strength 5/5 in all 4.  Psychiatric: Normal judgment and insight. Alert and oriented x 3. Normal mood.    Labs on Admission: I have personally reviewed following labs and imaging studies  CBC: Recent Labs  Lab 12/02/17 1455  WBC 12.4*  NEUTROABS 9.6*  HGB 11.9*  HCT 37.9  MCV 85.0  PLT 710   Basic Metabolic Panel: Recent Labs  Lab 12/02/17 1455  NA 137  K 4.0  CL 102  CO2 25  GLUCOSE 124*  BUN 15  CREATININE 0.82  CALCIUM 9.3   GFR: Estimated Creatinine Clearance: 97.1 mL/min (by C-G formula based on SCr of 0.82 mg/dL). Liver Function Tests: Recent Labs  Lab 12/02/17 1455  AST 23  ALT 24  ALKPHOS 123  BILITOT 1.4*  PROT 6.6  ALBUMIN 3.2*   No results for input(s): LIPASE, AMYLASE in the last 168 hours. No results for input(s): AMMONIA in the last 168 hours. Coagulation Profile: No results for input(s): INR, PROTIME in  the last 168 hours. Cardiac Enzymes: No results for input(s): CKTOTAL, CKMB, CKMBINDEX, TROPONINI in the last 168 hours. BNP (last 3 results) No results for input(s): PROBNP in the last 8760 hours. HbA1C: No results for input(s): HGBA1C in the last 72 hours. CBG: No results for input(s): GLUCAP in the last 168 hours. Lipid Profile: No results for input(s): CHOL, HDL, LDLCALC, TRIG, CHOLHDL, LDLDIRECT in the last 72 hours. Thyroid Function Tests: No results for input(s): TSH, T4TOTAL, FREET4, T3FREE, THYROIDAB in the last 72 hours. Anemia Panel: No results for input(s): VITAMINB12, FOLATE, FERRITIN, TIBC, IRON, RETICCTPCT in the last 72 hours. Urine analysis:    Component Value Date/Time   COLORURINE AMBER (A) 02/19/2016 1636   APPEARANCEUR TURBID (A) 02/19/2016 1636   LABSPEC 1.025 02/19/2016 1636   PHURINE 5.0 02/19/2016 1636   GLUCOSEU NEGATIVE 02/19/2016 1636   HGBUR NEGATIVE 02/19/2016 1636   BILIRUBINUR NEGATIVE 02/19/2016 1636   KETONESUR NEGATIVE 02/19/2016 1636   PROTEINUR NEGATIVE 02/19/2016 1636   NITRITE NEGATIVE 02/19/2016 1636   LEUKOCYTESUR NEGATIVE 02/19/2016 1636    Radiological Exams on Admission: Ct Abdomen Pelvis W Contrast  Result Date: 12/02/2017 CLINICAL DATA:  Patient thinks she has abscess over left hip region for which she had to have incision and drainage in past with IV antibiotics and feels like same thing happening again. EXAM: CT ABDOMEN AND PELVIS WITH CONTRAST TECHNIQUE: Multidetector CT imaging of the abdomen and pelvis was performed using the standard protocol following bolus administration of intravenous contrast. CONTRAST:  155mL OMNIPAQUE IOHEXOL 300 MG/ML  SOLN COMPARISON:  04/15/2016 and 02/19/2016 FINDINGS: Lower chest: Minimal right basilar atelectasis. Hepatobiliary: Single punctate gallstone near the gallbladder neck. Liver and biliary tree are normal Pancreas: Normal. Spleen: Normal. Adrenals/Urinary Tract: Adrenal glands are normal.  Kidneys normal size without hydronephrosis or nephrolithiasis. 3.1 cm left renal cyst. Ureters and bladder are normal. Stomach/Bowel: Stomach and small bowel are normal. Appendix is normal. Colostomy just left of midline over the mid abdomen unchanged. Vascular/Lymphatic: Within normal. Reproductive: Punctate calcification in the region of the left uterine cornua possibly tiny fibroid. Uterus and ovaries are otherwise unremarkable. Other: Region of thickening of the left  lateral abdominal wall beginning just below the ribs and extending inferiorly. There is mild stranding of the mesenteric fat within the adjacent peritoneum. There is a subtle elliptical fluid collection along the peritoneal surface of the abdominal wall measuring approximately 1.4 x 2.9 x 5 cm in transverse, AP and craniocaudal dimensions possibly a small infected collection/abscess. Mild subcutaneous fat within the adjacent lateral abdominal wall. Musculoskeletal: Minimal degenerative change of the spine. IMPRESSION: Focal thickening along the left lateral abdominal wall with elliptical fluid collection along the peritoneal side of this thickening measuring 1.4 x 2.9 x 5 cm with adjacent inflammatory change/fluid in the mesentery. This likely represents an infected collection/abscess. Colostomy site just left of midline over the anterior abdominal wall unchanged. Minimal cholelithiasis. 3.1 cm left renal cyst. Electronically Signed   By: Marin Olp M.D.   On: 12/02/2017 21:35    EKG: Independently reviewed.  Assessment/Plan Principal Problem:   Peritoneal abscess ([place]) Active Problems:   OSA on CPAP   Essential hypertension    1. Peritoneal abscess - 1. Empiric zosyn and given h/o MRSA, allergy to vanc will do doxycycline for now 1. Escalate to empiric dapto if status worsens 2. IR eval and possibly drain 3. Fluid culture 4. Pain control with PRN morphine 2. OSA - continue CPAP QHS 3. HTN - holding home BP meds for the moment  due to slightly lower BPs in ED.  DVT prophylaxis: SCDs Code Status: Full Family Communication: Family at bedside Disposition Plan: Home after admit Consults called: EDP spoke with Gen surg Admission status: Admit to inpatient  Severity of Illness: The appropriate patient status for this patient is INPATIENT. Inpatient status is judged to be reasonable and necessary in order to provide the required intensity of service to ensure the patient's safety. The patient's presenting symptoms, physical exam findings, and initial radiographic and laboratory data in the context of their chronic comorbidities is felt to place them at high risk for further clinical deterioration. Furthermore, it is not anticipated that the patient will be medically stable for discharge from the hospital within 2 midnights of admission. The following factors support the patient status of inpatient.   " The patient's presenting symptoms include Abd pain. " The worrisome physical exam findings include leukocytosis, abd TTP. " The initial radiographic and laboratory data are worrisome because of peritoneal abscess 1.4 x 2.9 x 5 cm. " The chronic co-morbidities include morbid obesity, s/p colostomy, prior MRSA abscess in this area.   * I certify that at the point of admission it is my clinical judgment that the patient will require inpatient hospital care spanning beyond 2 midnights from the point of admission due to high intensity of service, high risk for further deterioration and high frequency of surveillance required.Etta Quill DO [facility] Pager [phone] Only works nights!  If 7AM-7PM, please contact the primary day team physician taking care of patient  [email or web address] Password [email or web address]  12/02/2017, 11:10 PM

## 2017-12-02 NOTE — ED Triage Notes (Signed)
Pt thinks she has abscess on left hip area- internal and had to be cut open in surgery and IV antibiotics. Pt reports she feels like happening again. Not able to be visualized.

## 2017-12-02 NOTE — ED Notes (Signed)
Patient transported to CT 

## 2017-12-02 NOTE — ED Provider Notes (Signed)
[facility] EMERGENCY DEPARTMENT Provider Note   CSN: [id number] Arrival date & time: 12/02/17  1425     History   Chief Complaint No chief complaint on file.   HPI [name] [name] is a 57 y.o. female.  Patient with history of diverticulitis, complicated perforation and abscess, subsequent colostomy, presents with complaint of fevers, increasing left lower quadrant pain over the past 3 days.  H/o pericolic gutter abscess drained under IR in 02/2016 during admission. Patient has had associated nausea but no vomiting.  She has severe pain in the left lower quadrant that is much worse with palpation and movement.  No chest pain or shortness of breath.  Patient states that symptoms are similar to when she had her abscess in the past.  Denies any blood or decreased throughput into the colostomy.  States that she had some sanguinous drainage from the rectum approximately 2 weeks ago. The onset of this condition was acute. The course is constant.     Past Medical History:  Diagnosis Date  . Anemia   . Arthritis    "shoulders, ankles, back" (02/23/2016)  . Asthma   . COPD (chronic obstructive pulmonary disease) ([place])   . Depression   . Diarrhea age 14  . Diverticulitis SEP 2015   PERFORATED, SIGMOID COLON  . GERD (gastroesophageal reflux disease)   . Heart murmur   . History of hiatal hernia   . HTN (hypertension)   . Hyperlipidemia   . Kidney disease    "had kidney disease when I was a child"  . OSA on CPAP 1962   nasal; uncertain setting  . Restless legs syndrome     Patient Active Problem List   Diagnosis Date Noted  . Obesity, morbid ([place]) 12/12/2016  . Diverticulitis of colon   . Abdominal abscess 02/19/2016  . OSA on CPAP 02/19/2016  . Essential hypertension 02/19/2016  . Normocytic anemia 02/19/2016  . Diarrhea 03/23/2014  . Dyspepsia 03/23/2014    Past Surgical History:  Procedure Laterality Date  . ABSCESS DRAINAGE  ~ 02/20/2016   into my  stomach; radiology  . ANTERIOR AND POSTERIOR REPAIR     "w/my bladder tack"  . CERVICAL CONE BIOPSY    . COLONOSCOPY N/A 04/05/2014   IWL:NLGXQ HH/moderate diverticulosis  . COLONOSCOPY WITH PROPOFOL N/A 04/23/2016   Procedure: COLONOSCOPY WITH PROPOFOL;  Surgeon: Danie Binder, MD;  Location: [facility];  Service: Endoscopy;  Laterality: N/A;  1030  . 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 ESOPHAGOGASTRODUODENOSCOPY N/A 04/05/2014   [facility]:ERDEYCXK gastric polyps/mild erosive gastritis  . INCONTINENCE SURGERY     bladder tack  . IR GENERIC HISTORICAL  03/07/2016   IR RADIOLOGIST EVAL & MGMT 03/07/2016 [facility] INTERV RAD  . POLYPECTOMY  04/23/2016   Procedure: POLYPECTOMY;  Surgeon: Danie Binder, MD;  Location: [facility];  Service: Endoscopy;;  colon  . TUBAL LIGATION       OB History   None      Home Medications    Prior to Admission medications   Medication Sig Start Date End Date Taking? Authorizing Provider  albuterol (PROVENTIL HFA;VENTOLIN HFA) 108 (90 BASE) MCG/ACT inhaler Inhale into the lungs every 6 (six) hours as needed for wheezing or shortness of breath.    [provider]  atorvastatin (LIPITOR) 10 MG tablet Take 10 mg by mouth daily.     [provider]  CALCIUM-VITAMIN D PO Take 1 tablet by mouth daily.  [provider]  dicyclomine (BENTYL) 20 MG tablet Take 20 mg by mouth 4 (four) times daily as needed. For abdominal pain/cramping 01/10/16   [provider]  Diethylpropion HCl 25 MG TABS 1 po 1 hour prior to meals BID 12/12/16   Fields, Sandi L, MD  Fluticasone-Salmeterol (ADVAIR) 250-50 MCG/DOSE AEPB Inhale 1 puff into the lungs 2 (two) times daily.    [provider]  losartan (COZAAR) 50 MG tablet Take 50 mg by mouth daily.    [provider]  magnesium oxide (MAG-OX) 400 MG tablet Take 400 mg by mouth daily.    [provider]  meloxicam (MOBIC) 7.5 MG tablet 1-2 PO DAILY TO PREVENT KNEE  PAIN/SWELLING 12/12/16   Fields, Marga Melnick, MD  naproxen sodium (ANAPROX) 220 MG tablet Take 440 mg by mouth 2 (two) times daily as needed (for pain.).    [provider]  nebivolol (BYSTOLIC) 5 MG tablet Take 5 mg by mouth daily.    [provider]  ondansetron (ZOFRAN) 4 MG tablet Take 1 tablet (4 mg total) by mouth every 6 (six) hours as needed for nausea. 02/25/16   Theodis Blaze, MD  oxyCODONE-acetaminophen (PERCOCET/ROXICET) 5-325 MG tablet Take 1 tablet every 6 (six) hours as needed by mouth for moderate pain. 12/12/16   Fields, Marga Melnick, MD  pantoprazole (PROTONIX) 40 MG tablet Take 40 mg by mouth daily.    [provider]  Polyvinyl Alcohol (LUBRICANT DROPS OP) Place 1-2 drops into both eyes 3 (three) times daily as needed (for dry eyes.).    [provider]  pramipexole (MIRAPEX) 0.5 MG tablet Take 0.5 mg by mouth at bedtime.     [provider]  venlafaxine XR (EFFEXOR-XR) 150 MG 24 hr capsule Take 150 mg by mouth daily with breakfast.    [provider]  Vitamin D, Ergocalciferol, (DRISDOL) 50000 units CAPS capsule Take 50,000 Units by mouth every Thursday. 03/26/16   [provider]    Family History Family History  Problem Relation Age of Onset  . Diverticulitis Mother   . Diverticulitis Maternal Aunt   . Diverticulitis Maternal Grandmother     Social History Social History   Tobacco Use  . Smoking status: Former Smoker    Packs/day: 2.50    Years: 20.00    Pack years: 50.00    Types: Cigarettes    Last attempt to quit: 05/21/1997    Years since quitting: 20.5  . Smokeless tobacco: Never Used  Substance Use Topics  . Alcohol use: No    Alcohol/week: 0.0 standard drinks  . Drug use: No     Allergies   Penicillins; Sulfa antibiotics; Cortisone; Crestor [rosuvastatin calcium]; Dilaudid [hydromorphone hcl]; Neosporin [neomycin-bacitracin zn-polymyx]; and Vancomycin   Review of Systems Review of Systems    Constitutional: Positive for fever.  HENT: Negative for rhinorrhea and sore throat.   Eyes: Negative for redness.  Respiratory: Negative for cough.   Cardiovascular: Negative for chest pain.  Gastrointestinal: Positive for abdominal pain and nausea. Negative for blood in stool, diarrhea and vomiting.  Genitourinary: Negative for dysuria.  Musculoskeletal: Negative for myalgias.  Skin: Negative for rash.  Neurological: Negative for headaches.     Physical Exam Updated Vital Signs BP 112/74   Pulse 86   Temp 99.7 F (37.6 C) (Oral)   Resp 18   LMP  (LMP Unknown) Comment: menopausal  SpO2 95%   Physical Exam  Constitutional: She appears well-developed and well-nourished.  HENT:  Head: Normocephalic and atraumatic.  Mouth/Throat: Oropharynx is clear and moist.  Eyes: Conjunctivae are normal. Right eye exhibits no discharge. Left eye exhibits no discharge.  Neck: Normal range of motion. Neck supple.  Cardiovascular: Normal rate, regular rhythm and normal heart sounds.  Pulmonary/Chest: Effort normal and breath sounds normal.  Abdominal: Soft. There is tenderness (LLQ ). There is guarding. There is no rebound.  Habitus complicates exam but no superficial drainable abscess noted on external exam.  Neurological: She is alert.  Skin: Skin is warm and dry.  Psychiatric: She has a normal mood and affect.  Nursing note and vitals reviewed.    ED Treatments / Results  Labs (all labs ordered are listed, but only abnormal results are displayed) Labs Reviewed  CBC WITH DIFFERENTIAL/PLATELET - Abnormal; Notable for the following components:      Result Value   WBC 12.4 (*)    Hemoglobin 11.9 (*)    Neutro Abs 9.6 (*)    Monocytes Absolute 1.3 (*)    All other components within normal limits  COMPREHENSIVE METABOLIC PANEL - Abnormal; Notable for the following components:   Glucose, Bld 124 (*)    Albumin 3.2 (*)    Total Bilirubin 1.4 (*)    All other components within normal  limits  HIV ANTIBODY (ROUTINE TESTING W REFLEX)  I-STAT CG4 LACTIC ACID, ED  I-STAT CG4 LACTIC ACID, ED    EKG None  Radiology Ct Abdomen Pelvis W Contrast  Result Date: 12/02/2017 CLINICAL DATA:  Patient thinks she has abscess over left hip region for which she had to have incision and drainage in past with IV antibiotics and feels like same thing happening again. EXAM: CT ABDOMEN AND PELVIS WITH CONTRAST TECHNIQUE: Multidetector CT imaging of the abdomen and pelvis was performed using the standard protocol following bolus administration of intravenous contrast. CONTRAST:  123mL OMNIPAQUE IOHEXOL 300 MG/ML  SOLN COMPARISON:  04/15/2016 and 02/19/2016 FINDINGS: Lower chest: Minimal right basilar atelectasis. Hepatobiliary: Single punctate gallstone near the gallbladder neck. Liver and biliary tree are normal Pancreas: Normal. Spleen: Normal. Adrenals/Urinary Tract: Adrenal glands are normal. Kidneys normal size without hydronephrosis or nephrolithiasis. 3.1 cm left renal cyst. Ureters and bladder are normal. Stomach/Bowel: Stomach and small bowel are normal. Appendix is normal. Colostomy just left of midline over the mid abdomen unchanged. Vascular/Lymphatic: Within normal. Reproductive: Punctate calcification in the region of the left uterine cornua possibly tiny fibroid. Uterus and ovaries are otherwise unremarkable. Other: Region of thickening of the left lateral abdominal wall beginning just below the ribs and extending inferiorly. There is mild stranding of the mesenteric fat within the adjacent peritoneum. There is a subtle elliptical fluid collection along the peritoneal surface of the abdominal wall measuring approximately 1.4 x 2.9 x 5 cm in transverse, AP and craniocaudal dimensions possibly a small infected collection/abscess. Mild subcutaneous fat within the adjacent lateral abdominal wall. Musculoskeletal: Minimal degenerative change of the spine. IMPRESSION: Focal thickening along the left  lateral abdominal wall with elliptical fluid collection along the peritoneal side of this thickening measuring 1.4 x 2.9 x 5 cm with adjacent inflammatory change/fluid in the mesentery. This likely represents an infected collection/abscess. Colostomy site just left of midline over the anterior abdominal wall unchanged. Minimal cholelithiasis. 3.1 cm left renal cyst. Electronically Signed   By: Marin Olp M.D.   On: 12/02/2017 21:35    Procedures Procedures (including critical care time)  Medications Ordered in ED Medications  piperacillin-tazobactam (ZOSYN) IVPB 3.375 g (3.375 g Intravenous  New Bag/Given 12/02/17 2254)  morphine 2 MG/ML injection 2-4 mg (has no administration in time range)  acetaminophen (TYLENOL) tablet 650 mg (has no administration in time range)    Or  acetaminophen (TYLENOL) suppository 650 mg (has no administration in time range)  ondansetron (ZOFRAN) tablet 4 mg (has no administration in time range)    Or  ondansetron (ZOFRAN) injection 4 mg (has no administration in time range)  doxycycline (VIBRAMYCIN) 100 mg in sodium chloride 0.9 % 250 mL IVPB (has no administration in time range)  sodium chloride 0.9 % bolus 1,000 mL (0 mLs Intravenous Stopped 12/02/17 2201)  morphine 4 MG/ML injection 4 mg (4 mg Intravenous Given 12/02/17 2008)  ondansetron (ZOFRAN) injection 4 mg (4 mg Intravenous Given 12/02/17 2008)  iohexol (OMNIPAQUE) 300 MG/ML solution 100 mL (100 mLs Intravenous Contrast Given 12/02/17 2046)  clindamycin (CLEOCIN) IVPB 600 mg (0 mg Intravenous Stopped 12/02/17 2249)     Initial Impression / Assessment and Plan / ED Course  I have reviewed the triage vital signs and the nursing notes.  Pertinent labs & imaging results that were available during my care of the patient were reviewed by me and considered in my medical decision making (see chart for details).     Patient seen and examined. Work-up initiated. Medications ordered.  CT pending.  Vital  signs reviewed and are as follows: BP 112/74   Pulse 86   Temp 99.7 F (37.6 C) (Oral)   Resp 18   LMP  (LMP Unknown) Comment: menopausal  SpO2 95%   10:59 PM Patient discussed with and seen by Dr. Ronnald Nian.   Discussed findings by telephone with Dr. Ninfa Linden of general surgery.  No acute indications for surgery today.  Agrees with admission for IV antibiotics.  Patient discussed with Dr. Alcario Drought who will see.  Final Clinical Impressions(s) / ED Diagnoses   Final diagnoses:  Peritoneal abscess [facility])   Patient with abdominal wall inflammation and abscess.  Admit for IV antibiotics.  ED Discharge Orders    None       Carlisle Cater, PA-C 12/02/17 [place], [place], DO 12/02/17 2345

## 2017-12-02 NOTE — Progress Notes (Signed)
Pharmacy Antibiotic Note  [name] [name] is a 57 y.o. female admitted on 12/02/2017 with Intra-abdominal infection. Pt has history of perforated diverticulitis resulting in colostomy, presenting with worsening left lower quadrant pain over past 3 days. Spoke with patient regarding allergy to penicillins; pt reports reaction of SOB to penicillins was more than 10-15 years ago. Pt has tolerated Zosyn in the past. Pharmacy has been consulted for Zosyn dosing. WBC - 12.4, afebrile, lactic acid - WNL  Plan: Zosyn 3.375g IV q8h Monitor and adjust per clinical status, C&S  Height: 5' (152.4 cm)(pt reported) Weight: 292 lb (132.5 kg)(Pt reported) IBW/kg (Calculated) : 45.5  Temp (24hrs), Avg:99.7 F (37.6 C), Min:99.7 F (37.6 C), Max:99.7 F (37.6 C)  Recent Labs  Lab 12/02/17 1455 12/02/17 1503  WBC 12.4*  --   CREATININE 0.82  --   LATICACIDVEN  --  0.95    Estimated Creatinine Clearance: 97.1 mL/min (by C-G formula based on SCr of 0.82 mg/dL).    Allergies  Allergen Reactions  . Penicillins Shortness Of Breath    Tolerates Zosyn Has patient had a PCN reaction causing immediate rash, facial/tongue/throat swelling, SOB or lightheadedness with hypotension: Yes Has patient had a PCN reaction causing severe rash involving mucus membranes or skin necrosis: No Has patient had a PCN reaction that required hospitalization: No  Has patient had a PCN reaction occurring within the last 10 years: No If all of the above answers are "NO", then may proceed with Cephalosporin use.  . Sulfa Antibiotics Anaphylaxis    THROAT CLOSING UP  . Cortisone Hives  . Crestor [Rosuvastatin Calcium] Other (See Comments)    Liver toxicity  . Dilaudid [Hydromorphone Hcl] Other (See Comments)    Respiratory depression  . Neosporin [Neomycin-Bacitracin Zn-Polymyx] Hives  . Vancomycin Nausea And Vomiting    And Red Man's Syndrome    Antimicrobials this admission: Clindamycin 10/29 x1 in ED Zosyn 10/29 >>    Dose adjustments this admission: N/A  Microbiology results: Pending  Thank you for allowing pharmacy to be a part of this patient's care.  [name] [name] 12/02/2017 10:11 PM

## 2017-12-03 ENCOUNTER — Encounter (HOSPITAL_COMMUNITY): Payer: Self-pay | Admitting: General Practice

## 2017-12-03 ENCOUNTER — Other Ambulatory Visit: Payer: Self-pay

## 2017-12-03 LAB — PROTIME-INR
INR: 1.3
Prothrombin Time: 16.1 seconds — ABNORMAL HIGH (ref 11.4–15.2)

## 2017-12-03 LAB — MRSA PCR SCREENING: MRSA BY PCR: NEGATIVE

## 2017-12-03 LAB — I-STAT CG4 LACTIC ACID, ED: Lactic Acid, Venous: 0.8 mmol/L (ref 0.5–1.9)

## 2017-12-03 LAB — APTT: APTT: 34 s (ref 24–36)

## 2017-12-03 LAB — LACTIC ACID, PLASMA
Lactic Acid, Venous: 0.9 mmol/L (ref 0.5–1.9)
Lactic Acid, Venous: 1.1 mmol/L (ref 0.5–1.9)

## 2017-12-03 LAB — PROCALCITONIN: Procalcitonin: 0.1 ng/mL

## 2017-12-03 LAB — HIV ANTIBODY (ROUTINE TESTING W REFLEX): HIV SCREEN 4TH GENERATION: NONREACTIVE

## 2017-12-03 MED ORDER — SODIUM CHLORIDE 0.9 % IV SOLN
500.0000 mg | Freq: Every day | INTRAVENOUS | Status: DC
  Filled 2017-12-03: qty 10

## 2017-12-03 MED ORDER — VENLAFAXINE HCL ER 150 MG PO CP24
150.0000 mg | ORAL_CAPSULE | Freq: Every day | ORAL | Status: DC
  Administered 2017-12-03 – 2017-12-09 (×7): 150 mg via ORAL
  Filled 2017-12-03 (×7): qty 1

## 2017-12-03 MED ORDER — POLYVINYL ALCOHOL 1.4 % OP SOLN: 2.0000 [drp] | Freq: Three times a day (TID) | OPHTHALMIC | Status: DC | PRN

## 2017-12-03 MED ORDER — ATORVASTATIN CALCIUM 20 MG PO TABS
20.0000 mg | ORAL_TABLET | Freq: Every day | ORAL | Status: DC
  Administered 2017-12-03 – 2017-12-09 (×7): 20 mg via ORAL
  Filled 2017-12-03 (×7): qty 1

## 2017-12-03 MED ORDER — FLUTICASONE-UMECLIDIN-VILANT 100-62.5-25 MCG/INH IN AEPB: 1.0000 | INHALATION_SPRAY | Freq: Every day | RESPIRATORY_TRACT | Status: DC

## 2017-12-03 MED ORDER — HYDROCODONE-ACETAMINOPHEN 5-325 MG PO TABS
2.0000 | ORAL_TABLET | Freq: Once | ORAL | Status: AC
  Administered 2017-12-03: 2 via ORAL
  Filled 2017-12-03: qty 2

## 2017-12-03 MED ORDER — SODIUM CHLORIDE 0.9 % IV SOLN
6.0000 mg/kg | Freq: Every day | INTRAVENOUS | Status: AC
  Administered 2017-12-03 (×2): 482 mg via INTRAVENOUS
  Filled 2017-12-03 (×2): qty 9.64

## 2017-12-03 MED ORDER — VITAMIN D (ERGOCALCIFEROL) 1.25 MG (50000 UNIT) PO CAPS
50000.0000 [IU] | ORAL_CAPSULE | ORAL | Status: DC
  Administered 2017-12-04: 50000 [IU] via ORAL
  Filled 2017-12-03: qty 1

## 2017-12-03 MED ORDER — ALBUTEROL SULFATE (2.5 MG/3ML) 0.083% IN NEBU
2.5000 mg | INHALATION_SOLUTION | Freq: Four times a day (QID) | RESPIRATORY_TRACT | Status: DC | PRN
  Filled 2017-12-03: qty 20

## 2017-12-03 MED ORDER — SODIUM CHLORIDE 0.9 % IV BOLUS (SEPSIS)
500.0000 mL | Freq: Once | INTRAVENOUS | Status: AC
  Administered 2017-12-03: 500 mL via INTRAVENOUS

## 2017-12-03 MED ORDER — PRAMIPEXOLE DIHYDROCHLORIDE 0.25 MG PO TABS
0.5000 mg | ORAL_TABLET | Freq: Every evening | ORAL | Status: DC
  Administered 2017-12-03 – 2017-12-08 (×6): 0.5 mg via ORAL
  Filled 2017-12-03 (×8): qty 2

## 2017-12-03 MED ORDER — PANTOPRAZOLE SODIUM 40 MG PO TBEC
40.0000 mg | DELAYED_RELEASE_TABLET | Freq: Every day | ORAL | Status: DC
  Administered 2017-12-03 – 2017-12-09 (×7): 40 mg via ORAL
  Filled 2017-12-03 (×7): qty 1

## 2017-12-03 MED ORDER — DICYCLOMINE HCL 20 MG PO TABS: 20.0000 mg | ORAL_TABLET | Freq: Four times a day (QID) | ORAL | Status: DC | PRN

## 2017-12-03 MED ORDER — UMECLIDINIUM BROMIDE 62.5 MCG/INH IN AEPB
1.0000 | INHALATION_SPRAY | Freq: Every day | RESPIRATORY_TRACT | Status: DC
  Administered 2017-12-03 – 2017-12-09 (×7): 1 via RESPIRATORY_TRACT
  Filled 2017-12-03 (×2): qty 7

## 2017-12-03 MED ORDER — SODIUM CHLORIDE 0.9 % IV BOLUS (SEPSIS)
1000.0000 mL | Freq: Once | INTRAVENOUS | Status: AC
  Administered 2017-12-03: 1000 mL via INTRAVENOUS

## 2017-12-03 MED ORDER — FLUTICASONE FUROATE-VILANTEROL 100-25 MCG/INH IN AEPB
1.0000 | INHALATION_SPRAY | Freq: Every day | RESPIRATORY_TRACT | Status: DC
  Administered 2017-12-03 – 2017-12-09 (×7): 1 via RESPIRATORY_TRACT
  Filled 2017-12-03 (×2): qty 28

## 2017-12-03 NOTE — ED Notes (Signed)
Requested clerk page provider to report bp.

## 2017-12-03 NOTE — Progress Notes (Signed)
PROGRESS NOTE    [name] [name]  AYT:[id number] DOB: Jun 09, 1960 DOA: 12/02/2017 PCP: Moshe Cipro, MD  Outpatient Specialists:     Brief Narrative:  [name] [name] is a 57 y.o. female with medical history significant of OSA, HTN, perforated diverticulitis s/p colostomy.  MRSA peritoneal abscess in Jan 2018 s/p IR drainage, Dapto during hospital stay and doxycycline post hospital stay.  Patient presented to the ED with c/o fevers, increasing LLQ abd pain for the past 3 days.  Nausea but no vomiting.  No diarrhea.  Pain in similar location to the 2018 abscess.  CT confirms 1.4 x 2.9 x 5 cm abscess.  Patient is currently on daptomycin and Zosyn.  Patient has allergy to vancomycin.   Surgical team and interventional radiology team have been consulted.  Input is highly appreciated.  Patient seems to be improving.  Assessment & Plan:   Principal Problem:   Peritoneal abscess ([place]) Active Problems:   OSA on CPAP   Essential hypertension   Peritoneal abscess: Continue IV antibiotics as documented above. Input from the surgical and interventional radiology team appreciated. Optimize pain control.  OSA: Continue CPAP QHS   Hypertension/hypotension: Patient had a period that patient was noted to be hypotensive in the ER. Hypertensive medications on hold for now. Continue to monitor closely, and manage accordingly.  DVT prophylaxis: SCDs Code Status: Full Family Communication: Family at bedside Disposition Plan: Home after admit Consults called: EDP spoke with Gen surg  Consultants:   Surgery  Interventional radiology  Procedures:   None  Antimicrobials:   IV vancomycin  IV Zosyn   Subjective: Patient continues to report left lower abdominal quadrant pain.  Objective: Vitals:   12/03/17 1000 12/03/17 1100 12/03/17 1101 12/03/17 1200  BP: 100/78 114/65  123/71  Pulse: 81 (!) 25  (!) 112  Resp: (!) 22 17  (!) 23  Temp:    99.5 F (37.5 C)  TempSrc:    Oral  SpO2:  94% 95% 96% 92%  Weight:      Height:    5' (1.524 m)    Intake/Output Summary (Last 24 hours) at 12/03/2017 1456 Last data filed at 12/03/2017 1144 Gross per 24 hour  Intake 1640.7 ml  Output -  Net 1640.7 ml   Filed Weights   12/02/17 2200  Weight: 132.5 kg    Examination:  General exam: Appears calm and comfortable.  Patient is morbidly obese. Respiratory system: Clear to auscultation. Respiratory effort normal. Cardiovascular system: S1 & S2 heard, RRR. No JVD, murmurs, rubs, gallops or clicks. No pedal edema. Gastrointestinal system: Abdomen is morbidly obese, with vague left lower abdominal quadrant tenderness.  Organs are difficult to assess.  Normal bowel sounds heard. Central nervous system: Alert and oriented. No focal neurological deficits. Extremities: Symmetric 5 x 5 power. Psychiatry: Judgement and insight appear normal. Mood & affect appropriate.     Data Reviewed: I have personally reviewed following labs and imaging studies  CBC: Recent Labs  Lab 12/02/17 1455  WBC 12.4*  NEUTROABS 9.6*  HGB 11.9*  HCT 37.9  MCV 85.0  PLT 355   Basic Metabolic Panel: Recent Labs  Lab 12/02/17 1455  NA 137  K 4.0  CL 102  CO2 25  GLUCOSE 124*  BUN 15  CREATININE 0.82  CALCIUM 9.3   GFR: Estimated Creatinine Clearance: 97.1 mL/min (by C-G formula based on SCr of 0.82 mg/dL). Liver Function Tests: Recent Labs  Lab 12/02/17 1455  AST 23  ALT 24  ALKPHOS  123  BILITOT 1.4*  PROT 6.6  ALBUMIN 3.2*   No results for input(s): LIPASE, AMYLASE in the last 168 hours. No results for input(s): AMMONIA in the last 168 hours. Coagulation Profile: Recent Labs  Lab 12/03/17 0205  INR 1.30   Cardiac Enzymes: No results for input(s): CKTOTAL, CKMB, CKMBINDEX, TROPONINI in the last 168 hours. BNP (last 3 results) No results for input(s): PROBNP in the last 8760 hours. HbA1C: No results for input(s): HGBA1C in the last 72 hours. CBG: No results for  input(s): GLUCAP in the last 168 hours. Lipid Profile: No results for input(s): CHOL, HDL, LDLCALC, TRIG, CHOLHDL, LDLDIRECT in the last 72 hours. Thyroid Function Tests: No results for input(s): TSH, T4TOTAL, FREET4, T3FREE, THYROIDAB in the last 72 hours. Anemia Panel: No results for input(s): VITAMINB12, FOLATE, FERRITIN, TIBC, IRON, RETICCTPCT in the last 72 hours. Urine analysis:    Component Value Date/Time   COLORURINE AMBER (A) 02/19/2016 1636   APPEARANCEUR TURBID (A) 02/19/2016 1636   LABSPEC 1.025 02/19/2016 1636   PHURINE 5.0 02/19/2016 1636   GLUCOSEU NEGATIVE 02/19/2016 1636   HGBUR NEGATIVE 02/19/2016 1636   BILIRUBINUR NEGATIVE 02/19/2016 1636   KETONESUR NEGATIVE 02/19/2016 1636   PROTEINUR NEGATIVE 02/19/2016 1636   NITRITE NEGATIVE 02/19/2016 1636   LEUKOCYTESUR NEGATIVE 02/19/2016 1636   Sepsis Labs: @LABRCNTIP (procalcitonin:4,lacticidven:4)  ) Recent Results (from the past 240 hour(s))  Culture, blood (routine x 2)     Status: None (Preliminary result)   Collection Time: 12/03/17  1:59 AM  Result Value Ref Range Status   Specimen Description BLOOD RIGHT ANTECUBITAL  Final   Special Requests   Final    BOTTLES DRAWN AEROBIC AND ANAEROBIC Blood Culture adequate volume Performed at [facility], [place] [address] [place], [place] [address]    Culture PENDING  Incomplete   Report Status PENDING  Incomplete  Culture, blood (routine x 2)     Status: None (Preliminary result)   Collection Time: 12/03/17  2:08 AM  Result Value Ref Range Status   Specimen Description BLOOD RIGHT FOREARM  Final   Special Requests   Final    BOTTLES DRAWN AEROBIC ONLY Blood Culture results may not be optimal due to an inadequate volume of blood received in culture bottles Performed at [facility] [address] [place], [place] [address]    Culture PENDING  Incomplete   Report Status PENDING  Incomplete         Radiology Studies: Ct Abdomen Pelvis W  Contrast  Result Date: 12/02/2017 CLINICAL DATA:  Patient thinks she has abscess over left hip region for which she had to have incision and drainage in past with IV antibiotics and feels like same thing happening again. EXAM: CT ABDOMEN AND PELVIS WITH CONTRAST TECHNIQUE: Multidetector CT imaging of the abdomen and pelvis was performed using the standard protocol following bolus administration of intravenous contrast. CONTRAST:  161mL OMNIPAQUE IOHEXOL 300 MG/ML  SOLN COMPARISON:  04/15/2016 and 02/19/2016 FINDINGS: Lower chest: Minimal right basilar atelectasis. Hepatobiliary: Single punctate gallstone near the gallbladder neck. Liver and biliary tree are normal Pancreas: Normal. Spleen: Normal. Adrenals/Urinary Tract: Adrenal glands are normal. Kidneys normal size without hydronephrosis or nephrolithiasis. 3.1 cm left renal cyst. Ureters and bladder are normal. Stomach/Bowel: Stomach and small bowel are normal. Appendix is normal. Colostomy just left of midline over the mid abdomen unchanged. Vascular/Lymphatic: Within normal. Reproductive: Punctate calcification in the region of the left uterine cornua possibly tiny fibroid. Uterus and  ovaries are otherwise unremarkable. Other: Region of thickening of the left lateral abdominal wall beginning just below the ribs and extending inferiorly. There is mild stranding of the mesenteric fat within the adjacent peritoneum. There is a subtle elliptical fluid collection along the peritoneal surface of the abdominal wall measuring approximately 1.4 x 2.9 x 5 cm in transverse, AP and craniocaudal dimensions possibly a small infected collection/abscess. Mild subcutaneous fat within the adjacent lateral abdominal wall. Musculoskeletal: Minimal degenerative change of the spine. IMPRESSION: Focal thickening along the left lateral abdominal wall with elliptical fluid collection along the peritoneal side of this thickening measuring 1.4 x 2.9 x 5 cm with adjacent inflammatory  change/fluid in the mesentery. This likely represents an infected collection/abscess. Colostomy site just left of midline over the anterior abdominal wall unchanged. Minimal cholelithiasis. 3.1 cm left renal cyst. Electronically Signed   By: Marin Olp M.D.   On: 12/02/2017 21:35        Scheduled Meds: . atorvastatin  20 mg Oral Daily  . fluticasone furoate-vilanterol  1 puff Inhalation Daily   And  . umeclidinium bromide  1 puff Inhalation Daily  . pantoprazole  40 mg Oral Daily  . pramipexole  0.5 mg Oral QPM  . venlafaxine XR  150 mg Oral Q breakfast  . [START ON 12/04/2017] Vitamin D (Ergocalciferol)  50,000 Units Oral Q Thu   Continuous Infusions: . DAPTOmycin (CUBICIN)  IV Stopped (12/03/17 0413)  . [START ON 12/04/2017] DAPTOmycin (CUBICIN)  IV    . piperacillin-tazobactam (ZOSYN)  IV Stopped (12/03/17 1144)     LOS: 1 day    Time spent: 25 minutes    Dana Allan, MD  [facility] Pager #: [phone] 7PM-7AM contact night coverage as above

## 2017-12-03 NOTE — ED Notes (Signed)
Breakfast ordered 

## 2017-12-03 NOTE — Progress Notes (Signed)
  Asked to evaluate patient for image guided drainage/drain placement.  Imaging reviewed by Dr. Barbie Banner.  Abscess too small and ill defined, NOT amenable for drainage.  Ladajah Soltys S Marquavion Venhuizen PA-C 12/03/2017 9:14 AM

## 2017-12-03 NOTE — ED Notes (Signed)
Provider bedside.

## 2017-12-03 NOTE — Progress Notes (Signed)
Pt states she would rather wear oxygen for the night. RT made pt aware if she were to change her mind to let nurse know and they will call. RT will continue to monitor as needed.

## 2017-12-03 NOTE — Progress Notes (Signed)
Pharmacy Antibiotic Note  [name] [name] is a 57 y.o. female admitted on 12/02/2017 with sepsis.  Pharmacy has been consulted for daptomycin dosing.  Plan: Daptomycin 6mg /kg adjusted body weight q24 hours F/u ID in am  Height: 5' (152.4 cm)(pt reported) Weight: 292 lb (132.5 kg)(Pt reported) IBW/kg (Calculated) : 45.5  Temp (24hrs), Avg:99.7 F (37.6 C), Min:99.7 F (37.6 C), Max:99.7 F (37.6 C)  Recent Labs  Lab 12/02/17 1455 12/02/17 1503  WBC 12.4*  --   CREATININE 0.82  --   LATICACIDVEN  --  0.95    Estimated Creatinine Clearance: 97.1 mL/min (by C-G formula based on SCr of 0.82 mg/dL).    Allergies  Allergen Reactions  . Dilaudid [Hydromorphone Hcl] Shortness Of Breath and Other (See Comments)    Respiratory depression  . Hydromorphone Shortness Of Breath  . Metronidazole Other (See Comments)    Red Man Syndrome from IV form  . Penicillins Shortness Of Breath    (Tolerates Zosyn) Has patient had a PCN reaction causing immediate rash, facial/tongue/throat swelling, SOB or lightheadedness with hypotension: Yes Has patient had a PCN reaction causing severe rash involving mucus membranes or skin necrosis: No Has patient had a PCN reaction that required hospitalization: No  Has patient had a PCN reaction occurring within the last 10 years: No If all of the above answers are "NO", then may proceed with Cephalosporin use.  . Sulfa Antibiotics Anaphylaxis    "THROAT CLOSES UP"  . Vancomycin Nausea And Vomiting    And Red Man's Syndrome, also  . Benzalkonium Chloride Hives  . Cortisone Hives  . Crestor [Rosuvastatin Calcium] Other (See Comments)    Liver toxicity  . Daptomycin Nausea And Vomiting  . Neosporin [Neomycin-Bacitracin Zn-Polymyx] Hives  . Nsaids Other (See Comments)    Patient is not to take these because of GI bleeds  . Rosuvastatin Other (See Comments)    Liver toxicity     Thank you for allowing pharmacy to be a part of this patient's  care.  Excell Seltzer Poteet 12/03/2017 2:03 AM

## 2017-12-03 NOTE — Consult Note (Signed)
Reason for Consult:intra-abdominal abscess Referring Physician: Dr. Jennette Kettle  [name] [name] is an 57 y.o. female.  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  She has a previous history of an MRSA seeded left pericolic gutter abscess which was drained by interventional radiology in January 2018.  She has done well until the past weekend when she started developing left-sided abdominal pain which was worse with motion and bending.  She also reports having fevers.  She had nausea but no emesis.  Her ostomy has been working well.  She presented to the emergency department where she was found to have an intra-abdominal abscess on CT scan.  She describes her pain is sharp and mild to moderate.  She has been voiding well.  Past Medical History:  Diagnosis Date  . Anemia   . Arthritis    "shoulders, ankles, back" (02/23/2016)  . Asthma   . COPD (chronic obstructive pulmonary disease) ([place])   . Depression   . Diarrhea age 7  . Diverticulitis SEP 2015   PERFORATED, SIGMOID COLON  . GERD (gastroesophageal reflux disease)   . Heart murmur   . History of hiatal hernia   . HTN (hypertension)   . Hyperlipidemia   . Kidney disease    "had kidney disease when I was a child"  . OSA on CPAP 2130   nasal; uncertain setting  . Restless legs syndrome     Past Surgical History:  Procedure Laterality Date  . ABSCESS DRAINAGE  ~ 02/20/2016   into my stomach; radiology  . ANTERIOR AND POSTERIOR REPAIR     "w/my bladder tack"  . CERVICAL CONE BIOPSY    . COLONOSCOPY N/A 04/05/2014   QMV:HQION HH/moderate diverticulosis  . COLONOSCOPY WITH PROPOFOL N/A 04/23/2016   Procedure: COLONOSCOPY WITH PROPOFOL;  Surgeon: Danie Binder, MD;  [facility];  Service: Endoscopy;  Laterality: N/A;  1030  . 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 ESOPHAGOGASTRODUODENOSCOPY N/A 04/05/2014    GEX:BMWUXLKG gastric polyps/mild erosive gastritis  . INCONTINENCE SURGERY     bladder tack  . IR GENERIC HISTORICAL  03/07/2016   IR RADIOLOGIST EVAL & MGMT 03/07/2016 [facility] INTERV RAD  . POLYPECTOMY  04/23/2016   Procedure: POLYPECTOMY;  Surgeon: Danie Binder, MD;  Location: [facility];  Service: Endoscopy;;  colon  . TUBAL LIGATION      Family History  Problem Relation Age of Onset  . Diverticulitis Mother   . Diverticulitis Maternal Aunt   . Diverticulitis Maternal Grandmother     Social History:  reports that she quit smoking about 20 years ago. Her smoking use included cigarettes. She has a 50.00 pack-year smoking history. She has never used smokeless tobacco. She reports that she does not drink alcohol or use drugs.  Allergies:  Allergies  Allergen Reactions  . Dilaudid [Hydromorphone Hcl] Shortness Of Breath and Other (See Comments)    Respiratory depression  . Hydromorphone Shortness Of Breath  . Metronidazole Other (See Comments)    Red Man Syndrome from IV form  . Penicillins Shortness Of Breath    (Tolerates Zosyn) Has patient had a PCN reaction causing immediate rash, facial/tongue/throat swelling, SOB or lightheadedness with hypotension: Yes Has patient had a PCN reaction causing severe rash involving mucus membranes or skin necrosis: No Has patient had a PCN reaction that required hospitalization: No  Has patient had a  PCN reaction occurring within the last 10 years: No If all of the above answers are "NO", then may proceed with Cephalosporin use.  . Sulfa Antibiotics Anaphylaxis    "THROAT CLOSES UP"  . Vancomycin Nausea And Vomiting    And Red Man's Syndrome, also  . Benzalkonium Chloride Hives  . Cortisone Hives  . Crestor [Rosuvastatin Calcium] Other (See Comments)    Liver toxicity  . Daptomycin Nausea And Vomiting  . Neosporin [Neomycin-Bacitracin Zn-Polymyx] Hives  . Nsaids Other (See Comments)    Patient is not to take these because of GI bleeds   . Rosuvastatin Other (See Comments)    Liver toxicity    Medications: I have reviewed the patient's current medications.  Results for orders placed or performed during the hospital encounter of 12/02/17 (from the past 48 hour(s))  CBC with Differential     Status: Abnormal   Collection Time: 12/02/17  2:55 PM  Result Value Ref Range   WBC 12.4 (H) 4.0 - 10.5 K/uL   RBC 4.46 3.87 - 5.11 MIL/uL   Hemoglobin 11.9 (L) 12.0 - 15.0 g/dL   HCT 37.9 36.0 - 46.0 %   MCV 85.0 80.0 - 100.0 fL   MCH 26.7 26.0 - 34.0 pg   MCHC 31.4 30.0 - 36.0 g/dL   RDW 14.6 11.5 - 15.5 %   Platelets 312 150 - 400 K/uL   nRBC 0.0 0.0 - 0.2 %   Neutrophils Relative % 78 %   Neutro Abs 9.6 (H) 1.7 - 7.7 K/uL   Lymphocytes Relative 11 %   Lymphs Abs 1.3 0.7 - 4.0 K/uL   Monocytes Relative 10 %   Monocytes Absolute 1.3 (H) 0.1 - 1.0 K/uL   Eosinophils Relative 1 %   Eosinophils Absolute 0.1 0.0 - 0.5 K/uL   Basophils Relative 0 %   Basophils Absolute 0.0 0.0 - 0.1 K/uL   Immature Granulocytes 0 %   Abs Immature Granulocytes 0.04 0.00 - 0.07 K/uL    Comment: Performed at [facility], [address], [place], [place] [address]  Comprehensive metabolic panel     Status: Abnormal   Collection Time: 12/02/17  2:55 PM  Result Value Ref Range   Sodium 137 135 - 145 mmol/L   Potassium 4.0 3.5 - 5.1 mmol/L   Chloride 102 98 - 111 mmol/L   CO2 25 22 - 32 mmol/L   Glucose, Bld 124 (H) 70 - 99 mg/dL   BUN 15 6 - 20 mg/dL   Creatinine, Ser 0.82 0.44 - 1.00 mg/dL   Calcium 9.3 8.9 - 10.3 mg/dL   Total Protein 6.6 6.5 - 8.1 g/dL   Albumin 3.2 (L) 3.5 - 5.0 g/dL   AST 23 15 - 41 U/L   ALT 24 0 - 44 U/L   Alkaline Phosphatase 123 38 - 126 U/L   Total Bilirubin 1.4 (H) 0.3 - 1.2 mg/dL   GFR calc non Af Amer >60 >60 mL/min   GFR calc Af Amer >60 >60 mL/min    Comment: (NOTE) The eGFR has been calculated using the CKD EPI equation. This calculation has not been validated in all clinical situations. eGFR's  persistently <60 mL/min signify possible Chronic Kidney Disease.    Anion gap 10 5 - 15    Comment: Performed at [facility] [address] [place], [place] [address]  I-Stat CG4 Lactic Acid, ED     Status: None   Collection Time: 12/02/17  3:03  PM  Result Value Ref Range   Lactic Acid, Venous 0.95 0.5 - 1.9 mmol/L  Culture, blood (routine x 2)     Status: None (Preliminary result)   Collection Time: 12/03/17  1:59 AM  Result Value Ref Range   Specimen Description BLOOD RIGHT ANTECUBITAL    Special Requests      BOTTLES DRAWN AEROBIC AND ANAEROBIC Blood Culture adequate volume Performed at [facility] [address], [place], [place] [address]    Culture PENDING    Report Status PENDING   Lactic acid, plasma     Status: None   Collection Time: 12/03/17  2:05 AM  Result Value Ref Range   Lactic Acid, Venous 0.9 0.5 - 1.9 mmol/L    Comment: Performed at [facility] [address] [place], [place] [address]  Procalcitonin     Status: None   Collection Time: 12/03/17  2:05 AM  Result Value Ref Range   Procalcitonin 0.10 ng/mL    Comment:        Interpretation: PCT (Procalcitonin) <= 0.5 ng/mL: Systemic infection (sepsis) is not likely. Local bacterial infection is possible. (NOTE)       Sepsis PCT Algorithm           Lower Respiratory Tract                                      Infection PCT Algorithm    ----------------------------     ----------------------------         PCT < 0.25 ng/mL                PCT < 0.10 ng/mL         Strongly encourage             Strongly discourage   discontinuation of antibiotics    initiation of antibiotics    ----------------------------     -----------------------------       PCT 0.25 - 0.50 ng/mL            PCT 0.10 - 0.25 ng/mL               OR       >80% decrease in PCT            Discourage initiation of                                            antibiotics      Encourage discontinuation           of  antibiotics    ----------------------------     -----------------------------         PCT >= 0.50 ng/mL              PCT 0.26 - 0.50 ng/mL               AND        <80% decrease in PCT             Encourage initiation of                                             antibiotics  Encourage continuation           of antibiotics    ----------------------------     -----------------------------        PCT >= 0.50 ng/mL                  PCT > 0.50 ng/mL               AND         increase in PCT                  Strongly encourage                                      initiation of antibiotics    Strongly encourage escalation           of antibiotics                                     -----------------------------                                           PCT <= 0.25 ng/mL                                                 OR                                        > 80% decrease in PCT                                     Discontinue / Do not initiate                                             antibiotics Performed at [facility], [address], [place], [place] [address]   Protime-INR     Status: Abnormal   Collection Time: 12/03/17  2:05 AM  Result Value Ref Range   Prothrombin Time 16.1 (H) 11.4 - 15.2 seconds   INR 1.30     Comment: Performed at [facility] [address] [place], [place] [address]  APTT     Status: None   Collection Time: 12/03/17  2:05 AM  Result Value Ref Range   aPTT 34 24 - 36 seconds    Comment: Performed at [facility] [address] [place], [place] [address]  Culture, blood (routine x 2)     Status: None (Preliminary result)   Collection Time: 12/03/17  2:08 AM  Result Value Ref Range   Specimen Description BLOOD RIGHT FOREARM    Special Requests      BOTTLES DRAWN AEROBIC ONLY Blood Culture results may not be optimal due to an inadequate volume of blood received in culture bottles Performed  at [facility], [place] [address], [place], [place] [address]    Culture PENDING    Report Status PENDING   I-Stat CG4 Lactic Acid, ED     Status: None   Collection Time: 12/03/17  2:17 AM  Result Value Ref Range   Lactic Acid, Venous 0.80 0.5 - 1.9 mmol/L    Ct Abdomen Pelvis W Contrast  Result Date: 12/02/2017 CLINICAL DATA:  Patient thinks she has abscess over left hip region for which she had to have incision and drainage in past with IV antibiotics and feels like same thing happening again. EXAM: CT ABDOMEN AND PELVIS WITH CONTRAST TECHNIQUE: Multidetector CT imaging of the abdomen and pelvis was performed using the standard protocol following bolus administration of intravenous contrast. CONTRAST:  155m OMNIPAQUE IOHEXOL 300 MG/ML  SOLN COMPARISON:  04/15/2016 and 02/19/2016 FINDINGS: Lower chest: Minimal right basilar atelectasis. Hepatobiliary: Single punctate gallstone near the gallbladder neck. Liver and biliary tree are normal Pancreas: Normal. Spleen: Normal. Adrenals/Urinary Tract: Adrenal glands are normal. Kidneys normal size without hydronephrosis or nephrolithiasis. 3.1 cm left renal cyst. Ureters and bladder are normal. Stomach/Bowel: Stomach and small bowel are normal. Appendix is normal. Colostomy just left of midline over the mid abdomen unchanged. Vascular/Lymphatic: Within normal. Reproductive: Punctate calcification in the region of the left uterine cornua possibly tiny fibroid. Uterus and ovaries are otherwise unremarkable. Other: Region of thickening of the left lateral abdominal wall beginning just below the ribs and extending inferiorly. There is mild stranding of the mesenteric fat within the adjacent peritoneum. There is a subtle elliptical fluid collection along the peritoneal surface of the abdominal wall measuring approximately 1.4 x 2.9 x 5 cm in transverse, AP and craniocaudal dimensions possibly a small infected collection/abscess. Mild subcutaneous fat within the adjacent lateral abdominal  wall. Musculoskeletal: Minimal degenerative change of the spine. IMPRESSION: Focal thickening along the left lateral abdominal wall with elliptical fluid collection along the peritoneal side of this thickening measuring 1.4 x 2.9 x 5 cm with adjacent inflammatory change/fluid in the mesentery. This likely represents an infected collection/abscess. Colostomy site just left of midline over the anterior abdominal wall unchanged. Minimal cholelithiasis. 3.1 cm left renal cyst. Electronically Signed   By: DMarin OlpM.D.   On: 12/02/2017 21:35    Review of Systems  Constitutional: Positive for fever. Negative for chills.  Respiratory: Negative for cough and shortness of breath.   Cardiovascular: Negative for chest pain.  Gastrointestinal: Positive for abdominal pain and nausea. Negative for diarrhea and vomiting.  Genitourinary: Negative for dysuria.  Musculoskeletal: Positive for back pain.  All other systems reviewed and are negative.  Blood pressure 107/72, pulse 87, temperature 99.7 F (37.6 C), temperature source Oral, resp. rate (!) 21, height 5' (1.524 m), weight 132.5 kg, SpO2 94 %. Physical Exam  Constitutional: She is oriented to person, place, and time. She appears well-developed and well-nourished. No distress.  Morbidly obese female in no acute distress  HENT:  Head: Normocephalic and atraumatic.  Right Ear: External ear normal.  Left Ear: External ear normal.  Nose: Nose normal.  Mouth/Throat: Oropharynx is clear and moist. No oropharyngeal exudate.  Eyes: Pupils are equal, round, and reactive to light. Right eye exhibits no discharge. Left eye exhibits no discharge. No scleral icterus.  Neck: Normal range of motion. Neck supple. No tracheal deviation present.  Cardiovascular: Normal rate, regular rhythm, normal heart sounds and intact distal pulses.  No murmur heard. Respiratory: Effort normal and breath sounds  normal. No respiratory distress. She has no wheezes.  GI: Soft.  There is tenderness. There is no rebound.  There is mild tenderness with minimal guarding on the left abdomen toward the flank.  There is an ostomy bag in place.  She has a well-healed midline incision without hernia  Musculoskeletal: Normal range of motion. She exhibits no edema, tenderness or deformity.  Neurological: She is alert and oriented to person, place, and time.  Skin: Skin is warm and dry. She is not diaphoretic. No erythema.  Psychiatric: Her behavior is normal. Judgment normal.    Assessment/Plan: Intra-abdominal abscess  I have reviewed her CT scan showing a 1.4 x 2.9 x 5 cm subtle fluid collection along the left abdominal wall peritoneal surface with surrounding inflammatory changes.  This is in the same location of her previous abscess in January 2018 which at that time was 12 cm x 2.5 cm and was treated with antibiotics and an IR placed drain.  At this point, this abscess may be too small for drain but we may ask IR if fluid could be aspirated for cultures.  Currently, no surgical intervention is necessary.  Her white blood count is slightly elevated.  Her lactic acid level is normal.  She has been started on IV antibiotics.  We will follow her with you.    Andreu Drudge A 12/03/2017, 6:08 AM

## 2017-12-03 NOTE — ED Notes (Signed)
Explained to pt why she needed 2 IVs.  Pt agreed.  IV team bedside

## 2017-12-03 NOTE — Progress Notes (Signed)
Patient BP dropping into low 25Y systolic.  I have ordered 1.5L NS sepsis bolus, put her on sepsis pathway with procalcitonin and lactate ordered.  Have also switched doxy to daptomycin for at least one dose given h/o MRSA abdominal abscess at this exact same site in 2018, allergy to vanc, was treated with dapto in 2018.  (See the Jan 2018 admission for details).  Will need ID consult in AM to see if they want to continue empiric daptomycin or not.

## 2017-12-03 NOTE — ED Notes (Signed)
[facility] rn called to transport up stairs

## 2017-12-03 NOTE — ED Notes (Signed)
Report called to [place] [place] rn.

## 2017-12-04 DIAGNOSIS — G4733 Obstructive sleep apnea (adult) (pediatric): Secondary | ICD-10-CM

## 2017-12-04 DIAGNOSIS — Z9989 Dependence on other enabling machines and devices: Secondary | ICD-10-CM

## 2017-12-04 DIAGNOSIS — I10 Essential (primary) hypertension: Secondary | ICD-10-CM

## 2017-12-04 DIAGNOSIS — K651 Peritoneal abscess: Principal | ICD-10-CM

## 2017-12-04 LAB — CBC WITH DIFFERENTIAL/PLATELET
Abs Immature Granulocytes: 0.02 10*3/uL (ref 0.00–0.07)
Basophils Absolute: 0 10*3/uL (ref 0.0–0.1)
Basophils Relative: 0 %
Eosinophils Absolute: 0.2 10*3/uL (ref 0.0–0.5)
Eosinophils Relative: 2 %
HCT: 35.2 % — ABNORMAL LOW (ref 36.0–46.0)
Hemoglobin: 10.5 g/dL — ABNORMAL LOW (ref 12.0–15.0)
Immature Granulocytes: 0 %
Lymphocytes Relative: 18 %
Lymphs Abs: 1.4 10*3/uL (ref 0.7–4.0)
MCH: 25.6 pg — ABNORMAL LOW (ref 26.0–34.0)
MCHC: 29.8 g/dL — ABNORMAL LOW (ref 30.0–36.0)
MCV: 85.9 fL (ref 80.0–100.0)
Monocytes Absolute: 0.8 10*3/uL (ref 0.1–1.0)
Monocytes Relative: 10 %
Neutro Abs: 5.7 10*3/uL (ref 1.7–7.7)
Neutrophils Relative %: 70 %
Platelets: 334 10*3/uL (ref 150–400)
RBC: 4.1 MIL/uL (ref 3.87–5.11)
RDW: 14.7 % (ref 11.5–15.5)
WBC: 8.2 10*3/uL (ref 4.0–10.5)
nRBC: 0 % (ref 0.0–0.2)

## 2017-12-04 LAB — GLUCOSE, CAPILLARY
GLUCOSE-CAPILLARY: 60 mg/dL — AB (ref 70–99)
GLUCOSE-CAPILLARY: 135 mg/dL — AB (ref 70–99)
Glucose-Capillary: 119 mg/dL — ABNORMAL HIGH (ref 70–99)

## 2017-12-04 LAB — RENAL FUNCTION PANEL
Albumin: 2.7 g/dL — ABNORMAL LOW (ref 3.5–5.0)
Anion gap: 5 (ref 5–15)
BUN: 11 mg/dL (ref 6–20)
CO2: 26 mmol/L (ref 22–32)
Calcium: 8.3 mg/dL — ABNORMAL LOW (ref 8.9–10.3)
Chloride: 108 mmol/L (ref 98–111)
Creatinine, Ser: 0.75 mg/dL (ref 0.44–1.00)
GFR calc Af Amer: >60 mL/min (ref >60)
GFR calc non Af Amer: >60 mL/min (ref >60)
Glucose, Bld: 107 mg/dL — ABNORMAL HIGH (ref 70–99)
Phosphorus: 3.6 mg/dL (ref 2.5–4.6)
Potassium: 3.8 mmol/L (ref 3.5–5.1)
Sodium: 139 mmol/L (ref 135–145)

## 2017-12-04 LAB — CK: CK TOTAL: 52 U/L (ref 38–234)

## 2017-12-04 LAB — MAGNESIUM: Magnesium: 2.3 mg/dL (ref 1.7–2.4)

## 2017-12-04 MED ORDER — DOXYCYCLINE HYCLATE 100 MG PO TABS
100.0000 mg | ORAL_TABLET | Freq: Two times a day (BID) | ORAL | Status: DC
  Administered 2017-12-04 – 2017-12-09 (×10): 100 mg via ORAL
  Filled 2017-12-04 (×10): qty 1

## 2017-12-04 MED ORDER — HYDROCODONE-ACETAMINOPHEN 5-325 MG PO TABS
1.0000 | ORAL_TABLET | Freq: Four times a day (QID) | ORAL | Status: DC | PRN
  Administered 2017-12-04 – 2017-12-05 (×4): 2 via ORAL
  Administered 2017-12-05: 1 via ORAL
  Administered 2017-12-05 – 2017-12-09 (×15): 2 via ORAL
  Filled 2017-12-04 (×21): qty 2

## 2017-12-04 MED ORDER — CIPROFLOXACIN HCL 500 MG PO TABS
500.0000 mg | ORAL_TABLET | Freq: Two times a day (BID) | ORAL | Status: DC
  Administered 2017-12-04 – 2017-12-09 (×10): 500 mg via ORAL
  Filled 2017-12-04 (×10): qty 1

## 2017-12-04 NOTE — Progress Notes (Signed)
Pt refusing cpap for the night. ?

## 2017-12-04 NOTE — Progress Notes (Addendum)
PROGRESS NOTE   [name] [name]  OAC:[id number]    DOB: Apr 03, 1960    DOA: 12/02/2017  PCP: Moshe Cipro, MD   I have briefly reviewed patients previous medical records in [facility].  Brief Narrative:  57 year old female with PMH of perforated sigmoid diverticulitis status post resection and colostomy 2016 in [place], MRSA seeded left pericolic gutter abscess requiring IR drainage and antibiotics (daptomycin during hospital stay and doxycycline at discharge) January 2018, OSA on CPAP, asthma/COPD, anemia, HTN, HLD, GERD, depression & morbid obesity presented to ED on 12/02/2017 with left lower quadrant pain, fevers, nausea and noted to have small abdominal abscess.  IR and CCS consulted.  Abscess too small for IR drainage.  Empirically placed on IV daptomycin and Zosyn.   Assessment & Plan:   Principal Problem:   Peritoneal abscess ([place]) Active Problems:   OSA on CPAP   Essential hypertension   Intra-abdominal abscess: CT abdomen showed 1.4 x 2.9 x 5 cm subcu fluid collection along the left abdominal wall peritoneal surface with surrounding inflammatory change.  This is at the same location of her previous abscess in January 2018 which was larger, required IR drain and antibiotics as indicated above.  IR and CCS consulted.  As per IR, too small to drain percutaneously.  No surgical indication at this time.  Treating conservatively with IV antibiotics.  Was empirically started on IV Zosyn and daptomycin (MRSA in the past, vancomycin allergy).  MRSA PCR negative.  Blood cultures x2: Negative to date.  Added p.o. meds for pain control.  I discussed in detail with infectious MD on call who recommended transitioning to oral doxycycline + Cipro for 2-4 weeks depending on when she gets repeat imaging to ensure resolution of abscess. Changed Abx as above.  Will monitor overnight to ensure pain control and no decline prior to considering discharge in a.m.  Essential hypertension: Soft blood  pressures.  Was hypotensive on admission and resuscitated with IV fluids.  Holding antihypertensives.  Monitor closely.  Hyperlipidemia: Continue atorvastatin.  GERD: Asymptomatic.  Continue PPI.  Asthma/COPD/OSA on CPAP: Stable without clinical bronchospasm.  Declined use of CPAP overnight.  Continue prior home medications.  Depression: Stable.  Continue venlafaxine.  Anemia of chronic disease: Stable.  Status post colostomy.  Hypoglycemia: CBG 60 this morning.  Monitor CBGs and diet as tolerated.  Morbid obesity/Body mass index is 57.03 kg/m.    DVT prophylaxis: SCDs.  Added Lovenox. Code Status: Full Family Communication: None at bedside Disposition: DC home pending clinical improvement.   Consultants:  General surgery IR  Procedures:  None  Antimicrobials:  IV daptomycin and Zosyn   Subjective: Ongoing left lower quadrant pain, slightly better compared to yesterday, rates at 5/10 when lying still, worsens to 8/10 with movements.  Nausea without vomiting.  Having BM through colostomy.  Ambulating to bathroom.  Asking to have a shower.  ROS: As above, otherwise negative.  Objective:  Vitals:   12/04/17 0406 12/04/17 0820 12/04/17 1044 12/04/17 1049  BP: (!) 104/54 (!) 97/52  114/69  Pulse:      Resp:      Temp: 97.9 F (36.6 C) 98.1 F (36.7 C)    TempSrc: Oral Oral    SpO2:  98% 91%   Weight:      Height:        Examination:  General exam: Pleasant young female, moderately built and morbidly obese, lying comfortably propped up in bed.  Oral mucosa moist. Respiratory system: Clear to auscultation.  Respiratory effort normal. Cardiovascular system: S1 & S2 heard, RRR. No JVD, murmurs, rubs, gallops or clicks. No pedal edema. Gastrointestinal system: Abdomen is nondistended, soft.  Mild left lower quadrant tenderness without peritoneal signs.  Colostomy functioning normally. No organomegaly or masses felt. Normal bowel sounds heard. Central nervous  system: Alert and oriented. No focal neurological deficits. Extremities: Symmetric 5 x 5 power. Skin: No rashes, lesions or ulcers Psychiatry: Judgement and insight appear normal. Mood & affect appropriate.     Data Reviewed: I have personally reviewed following labs and imaging studies  CBC: Recent Labs  Lab 12/02/17 1455 12/04/17 0632  WBC 12.4* 8.2  NEUTROABS 9.6* 5.7  HGB 11.9* 10.5*  HCT 37.9 35.2*  MCV 85.0 85.9  PLT 312 109   Basic Metabolic Panel: Recent Labs  Lab 12/02/17 1455 12/04/17 0632  NA 137 139  K 4.0 3.8  CL 102 108  CO2 25 26  GLUCOSE 124* 107*  BUN 15 11  CREATININE 0.82 0.75  CALCIUM 9.3 8.3*  MG  --  2.3  PHOS  --  3.6   Liver Function Tests: Recent Labs  Lab 12/02/17 1455 12/04/17 0632  AST 23  --   ALT 24  --   ALKPHOS 123  --   BILITOT 1.4*  --   PROT 6.6  --   ALBUMIN 3.2* 2.7*   Coagulation Profile: Recent Labs  Lab 12/03/17 0205  INR 1.30   Cardiac Enzymes: Recent Labs  Lab 12/04/17 0632  CKTOTAL 52   HbA1C: No results for input(s): HGBA1C in the last 72 hours. CBG: Recent Labs  Lab 12/04/17 0816  GLUCAP 60*    Recent Results (from the past 240 hour(s))  Culture, blood (routine x 2)     Status: None (Preliminary result)   Collection Time: 12/03/17  1:59 AM  Result Value Ref Range Status   Specimen Description BLOOD RIGHT ANTECUBITAL  Final   Special Requests   Final    BOTTLES DRAWN AEROBIC AND ANAEROBIC Blood Culture adequate volume Performed at [facility], [place] [address], [place], [place] [address]    Culture PENDING  Incomplete   Report Status PENDING  Incomplete  Culture, blood (routine x 2)     Status: None (Preliminary result)   Collection Time: 12/03/17  2:08 AM  Result Value Ref Range Status   Specimen Description BLOOD RIGHT FOREARM  Final   Special Requests   Final    BOTTLES DRAWN AEROBIC ONLY Blood Culture results may not be optimal due to an inadequate volume of blood received in  culture bottles Performed at [facility] [address] [place], [place] [address]    Culture PENDING  Incomplete   Report Status PENDING  Incomplete  MRSA PCR Screening     Status: None   Collection Time: 12/03/17  1:00 PM  Result Value Ref Range Status   MRSA by PCR NEGATIVE NEGATIVE Final    Comment:        The GeneXpert MRSA Assay (FDA approved for NASAL specimens only), is one component of a comprehensive MRSA colonization surveillance program. It is not intended to diagnose MRSA infection nor to guide or monitor treatment for MRSA infections. Performed at [facility], [place] [address] [place], [place] [address]          Radiology Studies: Ct Abdomen Pelvis W Contrast  Result Date: 12/02/2017 CLINICAL DATA:  Patient thinks she has abscess over left hip region for which  she had to have incision and drainage in past with IV antibiotics and feels like same thing happening again. EXAM: CT ABDOMEN AND PELVIS WITH CONTRAST TECHNIQUE: Multidetector CT imaging of the abdomen and pelvis was performed using the standard protocol following bolus administration of intravenous contrast. CONTRAST:  112mL OMNIPAQUE IOHEXOL 300 MG/ML  SOLN COMPARISON:  04/15/2016 and 02/19/2016 FINDINGS: Lower chest: Minimal right basilar atelectasis. Hepatobiliary: Single punctate gallstone near the gallbladder neck. Liver and biliary tree are normal Pancreas: Normal. Spleen: Normal. Adrenals/Urinary Tract: Adrenal glands are normal. Kidneys normal size without hydronephrosis or nephrolithiasis. 3.1 cm left renal cyst. Ureters and bladder are normal. Stomach/Bowel: Stomach and small bowel are normal. Appendix is normal. Colostomy just left of midline over the mid abdomen unchanged. Vascular/Lymphatic: Within normal. Reproductive: Punctate calcification in the region of the left uterine cornua possibly tiny fibroid. Uterus and ovaries are otherwise unremarkable. Other: Region of thickening of the  left lateral abdominal wall beginning just below the ribs and extending inferiorly. There is mild stranding of the mesenteric fat within the adjacent peritoneum. There is a subtle elliptical fluid collection along the peritoneal surface of the abdominal wall measuring approximately 1.4 x 2.9 x 5 cm in transverse, AP and craniocaudal dimensions possibly a small infected collection/abscess. Mild subcutaneous fat within the adjacent lateral abdominal wall. Musculoskeletal: Minimal degenerative change of the spine. IMPRESSION: Focal thickening along the left lateral abdominal wall with elliptical fluid collection along the peritoneal side of this thickening measuring 1.4 x 2.9 x 5 cm with adjacent inflammatory change/fluid in the mesentery. This likely represents an infected collection/abscess. Colostomy site just left of midline over the anterior abdominal wall unchanged. Minimal cholelithiasis. 3.1 cm left renal cyst. Electronically Signed   By: Marin Olp M.D.   On: 12/02/2017 21:35        Scheduled Meds: . atorvastatin  20 mg Oral Daily  . fluticasone furoate-vilanterol  1 puff Inhalation Daily   And  . umeclidinium bromide  1 puff Inhalation Daily  . pantoprazole  40 mg Oral Daily  . pramipexole  0.5 mg Oral QPM  . venlafaxine XR  150 mg Oral Q breakfast  . Vitamin D (Ergocalciferol)  50,000 Units Oral Q Thu   Continuous Infusions: . DAPTOmycin (CUBICIN)  IV    . piperacillin-tazobactam (ZOSYN)  IV 3.375 g (12/04/17 1300)     LOS: 2 days     Vernell Leep, MD, FACP, [facility]. [facility] Pager [phone]  If 7PM-7AM, please contact night-coverage [email or web address] Password [email or web address] 12/04/2017, 1:15 PM

## 2017-12-04 NOTE — Progress Notes (Signed)
Patient ID: [name] [name], female   DOB: 1960/04/14, 57 y.o.   MRN: [id number]       Subjective: Says she feels about the same.  Tolerating a solid diet.  Colostomy working well.  Objective: Vital signs in last 24 hours: Temp:  [97.9 F (36.6 C)-99.5 F (37.5 C)] 98.1 F (36.7 C) (10/31 0820) Pulse Rate:  [25-112] 97 (10/30 1609) Resp:  [17-26] 26 (10/30 1609) BP: (97-123)/(52-78) 97/52 (10/31 0820) SpO2:  [92 %-96 %] 96 % (10/30 1609) Last BM Date: 12/01/17  Intake/Output from previous day: 10/30 0701 - 10/31 0700 In: 550 [IV Piggyback:550] Out: 0  Intake/Output this shift: No intake/output data recorded.  PE: Abd: soft, severely obese, colostomy in place with no current output.  Mildly tender in LLQ with deep palpation  Lab Results:  Recent Labs    12/02/17 1455 12/04/17 0632  WBC 12.4* 8.2  HGB 11.9* 10.5*  HCT 37.9 35.2*  PLT 312 334   BMET Recent Labs    12/02/17 1455 12/04/17 0632  NA 137 139  K 4.0 3.8  CL 102 108  CO2 25 26  GLUCOSE 124* 107*  BUN 15 11  CREATININE 0.82 0.75  CALCIUM 9.3 8.3*   PT/INR Recent Labs    12/03/17 0205  LABPROT 16.1*  INR 1.30   CMP     Component Value Date/Time   NA 139 12/04/2017 0632   K 3.8 12/04/2017 0632   CL 108 12/04/2017 0632   CO2 26 12/04/2017 0632   GLUCOSE 107 (H) 12/04/2017 0632   BUN 11 12/04/2017 0632   CREATININE 0.75 12/04/2017 0632   CALCIUM 8.3 (L) 12/04/2017 0632   PROT 6.6 12/02/2017 1455   ALBUMIN 2.7 (L) 12/04/2017 0632   AST 23 12/02/2017 1455   ALT 24 12/02/2017 1455   ALKPHOS 123 12/02/2017 1455   BILITOT 1.4 (H) 12/02/2017 1455   GFRNONAA >60 12/04/2017 0632   GFRAA >60 12/04/2017 6761   Lipase  No results found for: LIPASE     Studies/Results: Ct Abdomen Pelvis W Contrast  Result Date: 12/02/2017 CLINICAL DATA:  Patient thinks she has abscess over left hip region for which she had to have incision and drainage in past with IV antibiotics and feels like same thing  happening again. EXAM: CT ABDOMEN AND PELVIS WITH CONTRAST TECHNIQUE: Multidetector CT imaging of the abdomen and pelvis was performed using the standard protocol following bolus administration of intravenous contrast. CONTRAST:  112mL OMNIPAQUE IOHEXOL 300 MG/ML  SOLN COMPARISON:  04/15/2016 and 02/19/2016 FINDINGS: Lower chest: Minimal right basilar atelectasis. Hepatobiliary: Single punctate gallstone near the gallbladder neck. Liver and biliary tree are normal Pancreas: Normal. Spleen: Normal. Adrenals/Urinary Tract: Adrenal glands are normal. Kidneys normal size without hydronephrosis or nephrolithiasis. 3.1 cm left renal cyst. Ureters and bladder are normal. Stomach/Bowel: Stomach and small bowel are normal. Appendix is normal. Colostomy just left of midline over the mid abdomen unchanged. Vascular/Lymphatic: Within normal. Reproductive: Punctate calcification in the region of the left uterine cornua possibly tiny fibroid. Uterus and ovaries are otherwise unremarkable. Other: Region of thickening of the left lateral abdominal wall beginning just below the ribs and extending inferiorly. There is mild stranding of the mesenteric fat within the adjacent peritoneum. There is a subtle elliptical fluid collection along the peritoneal surface of the abdominal wall measuring approximately 1.4 x 2.9 x 5 cm in transverse, AP and craniocaudal dimensions possibly a small infected collection/abscess. Mild subcutaneous fat within the adjacent lateral abdominal wall. Musculoskeletal: Minimal  degenerative change of the spine. IMPRESSION: Focal thickening along the left lateral abdominal wall with elliptical fluid collection along the peritoneal side of this thickening measuring 1.4 x 2.9 x 5 cm with adjacent inflammatory change/fluid in the mesentery. This likely represents an infected collection/abscess. Colostomy site just left of midline over the anterior abdominal wall unchanged. Minimal cholelithiasis. 3.1 cm left renal  cyst. Electronically Signed   By: Marin Olp M.D.   On: 12/02/2017 21:35    Anti-infectives: Anti-infectives (From admission, onward)   Start     Dose/Rate Route Frequency Ordered Stop   12/04/17 2000  DAPTOmycin (CUBICIN) 500 mg in sodium chloride 0.9 % IVPB     500 mg 220 mL/hr over 30 Minutes Intravenous Daily 12/03/17 1329     12/03/17 0215  DAPTOmycin (CUBICIN) 482 mg in sodium chloride 0.9 % IVPB     6 mg/kg  80.3 kg (Adjusted) 219.3 mL/hr over 30 Minutes Intravenous Daily 12/03/17 0202 12/03/17 2114   12/02/17 2315  doxycycline (VIBRAMYCIN) 100 mg in sodium chloride 0.9 % 250 mL IVPB  Status:  Discontinued     100 mg 125 mL/hr over 120 Minutes Intravenous Every 12 hours 12/02/17 2304 12/03/17 0153   12/02/17 2230  piperacillin-tazobactam (ZOSYN) IVPB 3.375 g     3.375 g 12.5 mL/hr over 240 Minutes Intravenous Every 8 hours 12/02/17 2211     12/02/17 2200  clindamycin (CLEOCIN) IVPB 600 mg     600 mg 100 mL/hr over 30 Minutes Intravenous  Once 12/02/17 2146 12/02/17 2249       Assessment/Plan Abdominal fluid collection -IR unable to aspirate this fluid collection for cultures.  Recommend continued conservative management with abx therapy. -WBC is normal today  -previous collection site required a drain and grew MRSA in January of 2018. -tolerating a solid diet.  No plans for surgical intervention.  Could potentially transition to oral abx therapy and plan for DC soon with follow up with her previous surgeon.  FEN - heart healthy VTE - SCDs/ok for chemical prophylaxis from surgery standpoint ID - Cubacin/Zosyn   LOS: 2 days    Henreitta Cea , [facility] 12/04/2017, 9:12 AM Pager: [phone]

## 2017-12-05 LAB — CBC
HEMATOCRIT: 34.2 % — AB (ref 36.0–46.0)
Hemoglobin: 10.7 g/dL — ABNORMAL LOW (ref 12.0–15.0)
MCH: 26.5 pg (ref 26.0–34.0)
MCHC: 31.3 g/dL (ref 30.0–36.0)
MCV: 84.7 fL (ref 80.0–100.0)
NRBC: 0 % (ref 0.0–0.2)
Platelets: 330 10*3/uL (ref 150–400)
RBC: 4.04 MIL/uL (ref 3.87–5.11)
RDW: 14.5 % (ref 11.5–15.5)
WBC: 7.5 10*3/uL (ref 4.0–10.5)

## 2017-12-05 LAB — URINALYSIS, ROUTINE W REFLEX MICROSCOPIC
Bilirubin Urine: NEGATIVE
GLUCOSE, UA: NEGATIVE mg/dL
Hgb urine dipstick: NEGATIVE
KETONES UR: NEGATIVE mg/dL
LEUKOCYTES UA: NEGATIVE
Nitrite: NEGATIVE
PH: 5 (ref 5.0–8.0)
Protein, ur: NEGATIVE mg/dL
Specific Gravity, Urine: 1.012 (ref 1.005–1.030)

## 2017-12-05 LAB — GLUCOSE, CAPILLARY
GLUCOSE-CAPILLARY: 127 mg/dL — AB (ref 70–99)
Glucose-Capillary: 97 mg/dL (ref 70–99)
Glucose-Capillary: 128 mg/dL — ABNORMAL HIGH (ref 70–99)
Glucose-Capillary: 94 mg/dL (ref 70–99)

## 2017-12-05 NOTE — Progress Notes (Signed)
PROGRESS NOTE   [name] [name]  UXN:[id number]    DOB: 24-Mar-1960    DOA: 12/02/2017  PCP: Moshe Cipro, MD   I have briefly reviewed patients previous medical records in [facility].  Brief Narrative:  57 year old female with PMH of perforated sigmoid diverticulitis status post resection and colostomy 2016 in [place], MRSA seeded left pericolic gutter abscess requiring IR drainage and antibiotics (daptomycin during hospital stay and doxycycline at discharge) January 2018, OSA on CPAP, asthma/COPD, anemia, HTN, HLD, GERD, depression & morbid obesity presented to ED on 12/02/2017 with left lower quadrant pain, fevers, nausea and noted to have small abdominal abscess.  IR and CCS consulted.  Abscess too small for IR drainage.  Empirically placed on IV daptomycin and Zosyn.  Since clinically slowly improving, no fevers, leukocytosis and unremarkable clinical exam, transitioned to oral Cipro and doxycycline on 10/31.  As per surgery follow-up, due to ongoing LLQ pain, plan to repeat CT abdomen over the weekend to ensure abscess continues to improve.   Assessment & Plan:   Principal Problem:   Peritoneal abscess ([place]) Active Problems:   OSA on CPAP   Essential hypertension   Intra-abdominal abscess: CT abdomen showed 1.4 x 2.9 x 5 cm subcu fluid collection along the left abdominal wall peritoneal surface with surrounding inflammatory change.  This is at the same location of her previous abscess in January 2018 which was larger, required IR drain and antibiotics as indicated above.  IR and CCS consulted.  As per IR, too small to drain percutaneously.  No surgical indication at this time.  Treating conservatively with IV antibiotics.  Was empirically started on IV Zosyn and daptomycin (MRSA in the past, vancomycin allergy).  MRSA PCR negative.  Blood cultures x2: Negative to date.  Added p.o. meds for pain control.  I discussed in detail with infectious MD on call 10/31 who recommended  transitioning to oral doxycycline + Cipro for 2-4 weeks depending on when she gets repeat imaging to ensure resolution of abscess. Changed Abx as above.  I discussed with Dr. Brantley Stage, CCS on 11/1, due to patient's ongoing LLQ pain, plan to repeat CT abdomen over the weekend to ensure improving abscess prior to discharge.  Essential hypertension: Was hypotensive on admission and resuscitated with IV fluids.  Holding antihypertensives.  Reasonable inpatient control.  Hyperlipidemia: Continue atorvastatin.  GERD: Asymptomatic.  Continue PPI.  Asthma/COPD/OSA on CPAP: Stable without clinical bronchospasm.  Declined use of CPAP overnight.  Continue prior home medications.  Depression: Stable.  Continue venlafaxine.  Anemia of chronic disease: Stable.  Status post colostomy.  Hypoglycemia: CBG 60 on 10/31 morning.  No further hypoglycemic episodes.  Morbid obesity/Body mass index is 57.03 kg/m.    DVT prophylaxis: SCDs.  Added Lovenox. Code Status: Full Family Communication: None at bedside Disposition: DC home pending clinical improvement and further work-up as indicated above.   Consultants:  General surgery IR  Procedures:  None  Antimicrobials:  IV daptomycin and Zosyn-discontinued. Oral Cipro and doxycycline 10/31 >   Subjective: States that her left lower quadrant abdominal pain is only 10% improved compared to admission, worse with movement/bending forward.  Tolerating diet without nausea or vomiting and having BMs through the colostomy.  No other complaints reported.  ROS: As above, otherwise negative.  Objective:  Vitals:   12/04/17 1049 12/05/17 0759 12/05/17 0919 12/05/17 1221  BP: 114/69 129/86  119/65  Pulse: 96 82  67  Resp:    16  Temp:  98.2 F (  36.8 C)  98.2 F (36.8 C)  TempSrc:  Oral  Oral  SpO2:   90% 95%  Weight:      Height:        Examination:  General exam: Pleasant young female, moderately built and morbidly obese, lying comfortably  propped up in bed.  Oral mucosa moist.  Stable Respiratory system: Clear to auscultation. Respiratory effort normal.  Stable Cardiovascular system: S1 & S2 heard, RRR. No JVD, murmurs, rubs, gallops or clicks. No pedal edema. Gastrointestinal system: Abdomen is nondistended, soft and nontender.  Colostomy functioning normally. No organomegaly or masses felt. Normal bowel sounds heard. Central nervous system: Alert and oriented. No focal neurological deficits. Extremities: Symmetric 5 x 5 power. Skin: No rashes, lesions or ulcers Psychiatry: Judgement and insight appear normal. Mood & affect appropriate.     Data Reviewed: I have personally reviewed following labs and imaging studies  CBC: Recent Labs  Lab 12/02/17 1455 12/04/17 0632 12/05/17 0226  WBC 12.4* 8.2 7.5  NEUTROABS 9.6* 5.7  --   HGB 11.9* 10.5* 10.7*  HCT 37.9 35.2* 34.2*  MCV 85.0 85.9 84.7  PLT 312 334 702   Basic Metabolic Panel: Recent Labs  Lab 12/02/17 1455 12/04/17 0632  NA 137 139  K 4.0 3.8  CL 102 108  CO2 25 26  GLUCOSE 124* 107*  BUN 15 11  CREATININE 0.82 0.75  CALCIUM 9.3 8.3*  MG  --  2.3  PHOS  --  3.6   Liver Function Tests: Recent Labs  Lab 12/02/17 1455 12/04/17 0632  AST 23  --   ALT 24  --   ALKPHOS 123  --   BILITOT 1.4*  --   PROT 6.6  --   ALBUMIN 3.2* 2.7*   Coagulation Profile: Recent Labs  Lab 12/03/17 0205  INR 1.30   Cardiac Enzymes: Recent Labs  Lab 12/04/17 0632  CKTOTAL 52   HbA1C: No results for input(s): HGBA1C in the last 72 hours. CBG: Recent Labs  Lab 12/04/17 0816 12/04/17 1635 12/04/17 2122 12/05/17 0808 12/05/17 1131  GLUCAP 60* 135* 119* 97 128*    Recent Results (from the past 240 hour(s))  Culture, blood (routine x 2)     Status: None (Preliminary result)   Collection Time: 12/03/17  1:59 AM  Result Value Ref Range Status   Specimen Description BLOOD RIGHT ANTECUBITAL  Final   Special Requests   Final    BOTTLES DRAWN AEROBIC  AND ANAEROBIC Blood Culture adequate volume   Culture   Final    NO GROWTH 2 DAYS Performed at [facility], [place] [address] [place], [place] [address]    Report Status PENDING  Incomplete  Culture, blood (routine x 2)     Status: None (Preliminary result)   Collection Time: 12/03/17  2:08 AM  Result Value Ref Range Status   Specimen Description BLOOD RIGHT FOREARM  Final   Special Requests   Final    BOTTLES DRAWN AEROBIC ONLY Blood Culture results may not be optimal due to an inadequate volume of blood received in culture bottles   Culture   Final    NO GROWTH 2 DAYS Performed at [facility], [place] [address] [place], [place] [address]    Report Status PENDING  Incomplete  MRSA PCR Screening     Status: None   Collection Time: 12/03/17  1:00 PM  Result Value Ref Range Status   MRSA by PCR NEGATIVE NEGATIVE Final  Comment:        The GeneXpert MRSA Assay (FDA approved for NASAL specimens only), is one component of a comprehensive MRSA colonization surveillance program. It is not intended to diagnose MRSA infection nor to guide or monitor treatment for MRSA infections. Performed at [facility], [place] [address], [place], [place] [address]          Radiology Studies: No results found.      Scheduled Meds: . atorvastatin  20 mg Oral Daily  . ciprofloxacin  500 mg Oral BID  . doxycycline  100 mg Oral BID  . fluticasone furoate-vilanterol  1 puff Inhalation Daily   And  . umeclidinium bromide  1 puff Inhalation Daily  . pantoprazole  40 mg Oral Daily  . pramipexole  0.5 mg Oral QPM  . venlafaxine XR  150 mg Oral Q breakfast  . Vitamin D (Ergocalciferol)  50,000 Units Oral Q Thu   Continuous Infusions:    LOS: 3 days     Vernell Leep, MD, FACP, [facility]. [facility] Pager [phone]  If 7PM-7AM, please contact night-coverage [email or web address] Password [email or web address] 12/05/2017, 1:17 PM

## 2017-12-05 NOTE — Care Management Note (Addendum)
Case Management Note  Patient Details  Name: [name] [name] MRN: [id number] Date of Birth: October 08, 1960  Subjective/Objective:   Pt admitted with intra-abdominal abscess                 Action/Plan:  PTA independent from home with husband.  Pt has had colostomy since 2016 and is completely self sufficient - supplies are sent directly to her home.  Pt has PCP and denied barriers with paying for medications  Expected Discharge Date:                  Expected Discharge Plan:  Home/Self Care  In-House Referral:     Discharge planning Services  CM Consult  Post Acute Care Choice:    Choice offered to:     DME Arranged:    DME Agency:     HH Arranged:    HH Agency:     Status of Service:  In process, will continue to follow  If discussed at Long Length of Stay Meetings, dates discussed:    Additional Comments: 12/05/2017 If pt requires drain placement may need HHRN - CM will continue to follow for discharge needs Maryclare Labrador, RN 12/05/2017, 3:48 PM

## 2017-12-05 NOTE — Progress Notes (Signed)
Patient ID: [name] [name], female   DOB: [birth year], 57 y.o.   MRN: [id number]       Subjective: Patient states she is still having pain, mostly with mobilization.  Tolerating her diet with no issues.  Colostomy is working well.  Objective: Vital signs in last 24 hours: Temp:  [98.1 F (36.7 C)] 98.1 F (36.7 C) (10/31 0820) Pulse Rate:  [96] 96 (10/31 1049) BP: (97-114)/(52-69) 114/69 (10/31 1049) SpO2:  [91 %-98 %] 91 % (10/31 1044) Last BM Date: 12/04/17  Intake/Output from previous day: 10/31 0701 - 11/01 0700 In: 666.8 [P.O.:480; IV Piggyback:186.8] Out: 0  Intake/Output this shift: No intake/output data recorded.  PE: Abd: soft, mildly tender in LLQ, +BS, obese, colostomy with output present  Lab Results:  Recent Labs    12/04/17 0632 12/05/17 0226  WBC 8.2 7.5  HGB 10.5* 10.7*  HCT 35.2* 34.2*  PLT 334 330   BMET Recent Labs    12/02/17 1455 12/04/17 0632  NA 137 139  K 4.0 3.8  CL 102 108  CO2 25 26  GLUCOSE 124* 107*  BUN 15 11  CREATININE 0.82 0.75  CALCIUM 9.3 8.3*   PT/INR Recent Labs    12/03/17 0205  LABPROT 16.1*  INR 1.30   CMP     Component Value Date/Time   NA 139 12/04/2017 0632   K 3.8 12/04/2017 0632   CL 108 12/04/2017 0632   CO2 26 12/04/2017 0632   GLUCOSE 107 (H) 12/04/2017 0632   BUN 11 12/04/2017 0632   CREATININE 0.75 12/04/2017 0632   CALCIUM 8.3 (L) 12/04/2017 0632   PROT 6.6 12/02/2017 1455   ALBUMIN 2.7 (L) 12/04/2017 0632   AST 23 12/02/2017 1455   ALT 24 12/02/2017 1455   ALKPHOS 123 12/02/2017 1455   BILITOT 1.4 (H) 12/02/2017 1455   GFRNONAA >60 12/04/2017 0632   GFRAA >60 12/04/2017 5784   Lipase  No results found for: LIPASE     Studies/Results: No results found.  Anti-infectives: Anti-infectives (From admission, onward)   Start     Dose/Rate Route Frequency Ordered Stop   12/04/17 2200  doxycycline (VIBRA-TABS) tablet 100 mg     100 mg Oral 2 times daily 12/04/17 1346     12/04/17 2000   DAPTOmycin (CUBICIN) 500 mg in sodium chloride 0.9 % IVPB  Status:  Discontinued     500 mg 220 mL/hr over 30 Minutes Intravenous Daily 12/03/17 1329 12/04/17 1344   12/04/17 2000  ciprofloxacin (CIPRO) tablet 500 mg     500 mg Oral 2 times daily 12/04/17 1346     12/03/17 0215  DAPTOmycin (CUBICIN) 482 mg in sodium chloride 0.9 % IVPB     6 mg/kg  80.3 kg (Adjusted) 219.3 mL/hr over 30 Minutes Intravenous Daily 12/03/17 0202 12/03/17 2114   12/02/17 2315  doxycycline (VIBRAMYCIN) 100 mg in sodium chloride 0.9 % 250 mL IVPB  Status:  Discontinued     100 mg 125 mL/hr over 120 Minutes Intravenous Every 12 hours 12/02/17 2304 12/03/17 0153   12/02/17 2230  piperacillin-tazobactam (ZOSYN) IVPB 3.375 g  Status:  Discontinued     3.375 g 12.5 mL/hr over 240 Minutes Intravenous Every 8 hours 12/02/17 2211 12/04/17 1344   12/02/17 2200  clindamycin (CLEOCIN) IVPB 600 mg     600 mg 100 mL/hr over 30 Minutes Intravenous  Once 12/02/17 2146 12/02/17 2249       Assessment/Plan Abdominal fluid collection -IR unable to aspirate this fluid  collection for cultures.  Recommend continued conservative management with abx therapy. -WBC is normal today  -previous collection site required a drain and grew MRSA in January of 2018.   -still with pain, mostly with mobilization.  She is AF, normal WBC, and having normal bowel function.  Still treat conservatively with no plans for surgical intervention.   I suppose if her pain persists and she is unable to be discharged, a repeat CT scan sometime over the weekend could be considered to relook at this area to see if it drainable.  FEN - heart healthy VTE - SCDs/ok for chemical prophylaxis from surgery standpoint ID - Cipro/Doxy   LOS: 3 days    Henreitta Cea , [facility] 12/05/2017, 7:47 AM Pager: [phone]

## 2017-12-06 ENCOUNTER — Inpatient Hospital Stay (HOSPITAL_COMMUNITY): Payer: BLUE CROSS/BLUE SHIELD

## 2017-12-06 LAB — GLUCOSE, CAPILLARY
GLUCOSE-CAPILLARY: 81 mg/dL (ref 70–99)
GLUCOSE-CAPILLARY: 89 mg/dL (ref 70–99)
GLUCOSE-CAPILLARY: 107 mg/dL — AB (ref 70–99)
GLUCOSE-CAPILLARY: 114 mg/dL — AB (ref 70–99)

## 2017-12-06 MED ORDER — IOHEXOL 300 MG/ML  SOLN
100.0000 mL | Freq: Once | INTRAMUSCULAR | Status: AC | PRN
  Administered 2017-12-06: 100 mL via INTRAVENOUS

## 2017-12-06 NOTE — Progress Notes (Signed)
PROGRESS NOTE   [name] [name]  KGM:[id number]    DOB: [birth year]    DOA: 12/02/2017  PCP: Moshe Cipro, MD   I have briefly reviewed patients previous medical records in [facility].  Brief Narrative:  57 year old female with PMH of perforated sigmoid diverticulitis status post resection and colostomy 2016 in [place], MRSA seeded left pericolic gutter abscess requiring IR drainage and antibiotics (daptomycin during hospital stay and doxycycline at discharge) January 2018, OSA on CPAP, asthma/COPD, anemia, HTN, HLD, GERD, depression & morbid obesity presented to ED on 12/02/2017 with left lower quadrant pain, fevers, nausea and noted to have small abdominal abscess.  IR and CCS consulted.  Abscess too small for IR drainage.  Empirically placed on IV daptomycin and Zosyn.  Since clinically slowly improving, no fevers, leukocytosis and unremarkable clinical exam, transitioned to oral Cipro and doxycycline on 10/31.  As per surgery follow-up, due to ongoing LLQ pain, plan to repeat CT abdomen (ordered 11/2) to ensure abscess continues to improve.   Assessment & Plan:   Principal Problem:   Peritoneal abscess ([place]) Active Problems:   OSA on CPAP   Essential hypertension   Intra-abdominal abscess: CT abdomen showed 1.4 x 2.9 x 5 cm subcu fluid collection along the left abdominal wall peritoneal surface with surrounding inflammatory change.  This is at the same location of her previous abscess in January 2018 which was larger, required IR drain and antibiotics as indicated above.  IR and CCS consulted.  As per IR, too small to drain percutaneously.  No surgical indication at this time.  Was empirically started on IV Zosyn and daptomycin (MRSA in the past, vancomycin allergy).  MRSA PCR negative.  Blood cultures x2: Negative to date.    I discussed in detail with infectious MD on call 10/31 who recommended transitioning to oral doxycycline + Cipro for 2-4 weeks depending on when she gets  repeat imaging to ensure resolution of abscess. Changed Abx as above.  Continues to gradually improve.  CCS follow-up appreciated, CT abdomen ordered to reassess, if abscess stable or improved, DC home but if larger may need to reconsult IR for drainage.  Essential hypertension: Was hypotensive on admission and resuscitated with IV fluids.  Holding antihypertensives.  Reasonable inpatient control.  Hyperlipidemia: Continue atorvastatin.  GERD: Asymptomatic.  Continue PPI.  Asthma/COPD/OSA on CPAP: Stable without clinical bronchospasm.  Declining use of CPAP overnight.  Continue prior home medications.  Depression: Stable.  Continue venlafaxine.  Anemia of chronic disease: Stable.  Status post colostomy.  Hypoglycemia: CBG 60 on 10/31 morning.  No further hypoglycemic episodes.  DC CBGs  Morbid obesity/Body mass index is 57.03 kg/m.    DVT prophylaxis: SCDs.  Added Lovenox. Code Status: Full Family Communication: None at bedside Disposition:  pending review of CT abdomen results.   Consultants:  General surgery IR  Procedures:  None  Antimicrobials:  IV daptomycin and Zosyn-discontinued. Oral Cipro and doxycycline 10/31 >   Subjective: Slowly improving.  Anterior abdominal pain down to 4/10.  Still more on bending forward.  Reports some more left flank pain which she had on admission but slightly worse at 6/10 in severity.  Tolerating diet without difficulty.  Colostomy working well.  ROS: As above, otherwise negative.  Objective:  Vitals:   12/05/17 2346 12/06/17 0500 12/06/17 0746 12/06/17 0901  BP: (!) 140/93 104/60 118/73   Pulse: 90  92   Resp: 20 14 18    Temp: 98 F (36.7 C) 97.7 F (36.5 C)  97.7 F (36.5 C)   TempSrc: Axillary Oral Oral   SpO2: 94% 93% 100% 98%  Weight:      Height:        Examination:  General exam: Pleasant young female, moderately built and morbidly obese, lying comfortably propped up in bed.  Does not appear in any  distress Respiratory system: Clear to auscultation. Respiratory effort normal.  Stable Cardiovascular system: S1 & S2 heard, RRR. No JVD, murmurs, rubs, gallops or clicks. No pedal edema. Gastrointestinal system: Abdomen is nondistended, soft and nontender.  No organomegaly or masses appreciated.  Colostomy functioning well.  Normal bowel sounds heard. Central nervous system: Alert and oriented. No focal neurological deficits. Extremities: Symmetric 5 x 5 power. Skin: No rashes, lesions or ulcers Psychiatry: Judgement and insight appear normal. Mood & affect appropriate.     Data Reviewed: I have personally reviewed following labs and imaging studies  CBC: Recent Labs  Lab 12/02/17 1455 12/04/17 0632 12/05/17 0226  WBC 12.4* 8.2 7.5  NEUTROABS 9.6* 5.7  --   HGB 11.9* 10.5* 10.7*  HCT 37.9 35.2* 34.2*  MCV 85.0 85.9 84.7  PLT 312 334 124   Basic Metabolic Panel: Recent Labs  Lab 12/02/17 1455 12/04/17 0632  NA 137 139  K 4.0 3.8  CL 102 108  CO2 25 26  GLUCOSE 124* 107*  BUN 15 11  CREATININE 0.82 0.75  CALCIUM 9.3 8.3*  MG  --  2.3  PHOS  --  3.6   Liver Function Tests: Recent Labs  Lab 12/02/17 1455 12/04/17 0632  AST 23  --   ALT 24  --   ALKPHOS 123  --   BILITOT 1.4*  --   PROT 6.6  --   ALBUMIN 3.2* 2.7*   Coagulation Profile: Recent Labs  Lab 12/03/17 0205  INR 1.30   Cardiac Enzymes: Recent Labs  Lab 12/04/17 0632  CKTOTAL 52   HbA1C: No results for input(s): HGBA1C in the last 72 hours. CBG: Recent Labs  Lab 12/05/17 0808 12/05/17 1131 12/05/17 1732 12/05/17 2140 12/06/17 0748  GLUCAP 97 128* 94 127* 81    Recent Results (from the past 240 hour(s))  Culture, blood (routine x 2)     Status: None (Preliminary result)   Collection Time: 12/03/17  1:59 AM  Result Value Ref Range Status   Specimen Description BLOOD RIGHT ANTECUBITAL  Final   Special Requests   Final    BOTTLES DRAWN AEROBIC AND ANAEROBIC Blood Culture adequate  volume   Culture   Final    NO GROWTH 3 DAYS Performed at [facility], [address], [place], [place] [address]    Report Status PENDING  Incomplete  Culture, blood (routine x 2)     Status: None (Preliminary result)   Collection Time: 12/03/17  2:08 AM  Result Value Ref Range Status   Specimen Description BLOOD RIGHT FOREARM  Final   Special Requests   Final    BOTTLES DRAWN AEROBIC ONLY Blood Culture results may not be optimal due to an inadequate volume of blood received in culture bottles   Culture   Final    NO GROWTH 3 DAYS Performed at [facility], [place] [address] [place], [place] [address]    Report Status PENDING  Incomplete  MRSA PCR Screening     Status: None   Collection Time: 12/03/17  1:00 PM  Result Value Ref Range Status   MRSA by PCR NEGATIVE NEGATIVE Final  Comment:        The GeneXpert MRSA Assay (FDA approved for NASAL specimens only), is one component of a comprehensive MRSA colonization surveillance program. It is not intended to diagnose MRSA infection nor to guide or monitor treatment for MRSA infections. Performed at [facility], [place] [address] [place], [place] [address]          Radiology Studies: No results found.      Scheduled Meds: . atorvastatin  20 mg Oral Daily  . ciprofloxacin  500 mg Oral BID  . doxycycline  100 mg Oral BID  . fluticasone furoate-vilanterol  1 puff Inhalation Daily   And  . umeclidinium bromide  1 puff Inhalation Daily  . pantoprazole  40 mg Oral Daily  . pramipexole  0.5 mg Oral QPM  . venlafaxine XR  150 mg Oral Q breakfast  . Vitamin D (Ergocalciferol)  50,000 Units Oral Q Thu   Continuous Infusions:    LOS: 4 days     Vernell Leep, MD, FACP, [facility]. [facility] Pager [phone]  If 7PM-7AM, please contact night-coverage [email or web address] Password [facility] 12/06/2017, 11:41 AM

## 2017-12-06 NOTE — Progress Notes (Signed)
Patient refusing our CPAP for the night. Made patient aware if she could get someone to bring her home mask (nasal pillows) we could fit it to our machine.

## 2017-12-06 NOTE — Progress Notes (Signed)
Patient ID: [name] [name], female   DOB: 05-07-1960, 57 y.o.   MRN: [id number]       Subjective: Patient states she is improving - pain better today.  Tolerating her diet with no issues.  Colostomy is working well.  Objective: Vital signs in last 24 hours: Temp:  [97.7 F (36.5 C)-98.5 F (36.9 C)] 97.7 F (36.5 C) (11/02 0746) Pulse Rate:  [67-99] 92 (11/02 0746) Resp:  [14-20] 18 (11/02 0746) BP: (104-140)/(54-93) 118/73 (11/02 0746) SpO2:  [93 %-100 %] 98 % (11/02 0901) Last BM Date: 12/05/17  Intake/Output from previous day: 11/01 0701 - 11/02 0700 In: 660 [P.O.:660] Out: -  Intake/Output this shift: No intake/output data recorded.  PE: Abd: soft, minimal tender in LLQ, +BS, obese, colostomy with output present  Lab Results:  Recent Labs    12/04/17 0632 12/05/17 0226  WBC 8.2 7.5  HGB 10.5* 10.7*  HCT 35.2* 34.2*  PLT 334 330   BMET Recent Labs    12/04/17 0632  NA 139  K 3.8  CL 108  CO2 26  GLUCOSE 107*  BUN 11  CREATININE 0.75  CALCIUM 8.3*   PT/INR No results for input(s): LABPROT, INR in the last 72 hours. CMP     Component Value Date/Time   NA 139 12/04/2017 0632   K 3.8 12/04/2017 0632   CL 108 12/04/2017 0632   CO2 26 12/04/2017 0632   GLUCOSE 107 (H) 12/04/2017 0632   BUN 11 12/04/2017 0632   CREATININE 0.75 12/04/2017 0632   CALCIUM 8.3 (L) 12/04/2017 0632   PROT 6.6 12/02/2017 1455   ALBUMIN 2.7 (L) 12/04/2017 0632   AST 23 12/02/2017 1455   ALT 24 12/02/2017 1455   ALKPHOS 123 12/02/2017 1455   BILITOT 1.4 (H) 12/02/2017 1455   GFRNONAA >60 12/04/2017 0632   GFRAA >60 12/04/2017 5885   Lipase  No results found for: LIPASE     Studies/Results: No results found.  Anti-infectives: Anti-infectives (From admission, onward)   Start     Dose/Rate Route Frequency Ordered Stop   12/04/17 2200  doxycycline (VIBRA-TABS) tablet 100 mg     100 mg Oral 2 times daily 12/04/17 1346     12/04/17 2000  DAPTOmycin (CUBICIN) 500 mg in  sodium chloride 0.9 % IVPB  Status:  Discontinued     500 mg 220 mL/hr over 30 Minutes Intravenous Daily 12/03/17 1329 12/04/17 1344   12/04/17 2000  ciprofloxacin (CIPRO) tablet 500 mg     500 mg Oral 2 times daily 12/04/17 1346     12/03/17 0215  DAPTOmycin (CUBICIN) 482 mg in sodium chloride 0.9 % IVPB     6 mg/kg  80.3 kg (Adjusted) 219.3 mL/hr over 30 Minutes Intravenous Daily 12/03/17 0202 12/03/17 2114   12/02/17 2315  doxycycline (VIBRAMYCIN) 100 mg in sodium chloride 0.9 % 250 mL IVPB  Status:  Discontinued     100 mg 125 mL/hr over 120 Minutes Intravenous Every 12 hours 12/02/17 2304 12/03/17 0153   12/02/17 2230  piperacillin-tazobactam (ZOSYN) IVPB 3.375 g  Status:  Discontinued     3.375 g 12.5 mL/hr over 240 Minutes Intravenous Every 8 hours 12/02/17 2211 12/04/17 1344   12/02/17 2200  clindamycin (CLEOCIN) IVPB 600 mg     600 mg 100 mL/hr over 30 Minutes Intravenous  Once 12/02/17 2146 12/02/17 2249       Assessment/Plan Abdominal fluid collection -IR unable to aspirate this fluid collection for cultures.  Recommend continued conservative management  with abx therapy. -previous collection site required a drain and grew MRSA in January of 2018.   -Repeat CT ordered - if stable in appearance or improved, ok for discharge from our perspective on abx; if larger; may re-discuss with IR FEN - heart healthy VTE - SCDs/ok for chemical prophylaxis from surgery standpoint ID - Cipro/Doxy   LOS: 4 days    Sharon Mt. Dema Severin, M.D. [facility]

## 2017-12-07 ENCOUNTER — Inpatient Hospital Stay (HOSPITAL_COMMUNITY): Payer: BLUE CROSS/BLUE SHIELD

## 2017-12-07 LAB — GLUCOSE, CAPILLARY
GLUCOSE-CAPILLARY: 108 mg/dL — AB (ref 70–99)
GLUCOSE-CAPILLARY: 135 mg/dL — AB (ref 70–99)
Glucose-Capillary: 93 mg/dL (ref 70–99)
Glucose-Capillary: 140 mg/dL — ABNORMAL HIGH (ref 70–99)

## 2017-12-07 MED ORDER — LIDOCAINE HCL (PF) 1 % IJ SOLN
INTRAMUSCULAR | Status: AC
  Filled 2017-12-07: qty 30

## 2017-12-07 NOTE — Progress Notes (Signed)
Referring Physician(s): [place]  Supervising Physician: Corrie Mckusick  Patient Status:  [facility] - In-pt  Chief Complaint:  Left abdominal pain  Subjective: Patient familiar to IR service from prior left lower quadrant diverticular abscess drainage on 02/20/2016 with subsequent drain removal on 03/07/2016.  She has a prior history of perforated diverticulitis requiring left colectomy/colostomy in 2016.  Drain fluid cultures at the time grew MRSA.  She now presents with continued left-sided abdominal discomfort and recent imaging revealing enlarging abscess along the peritoneal surface of the left lateral abdominal wall.  Request received for image guided aspiration/possible drainage.  Fever, headache vomiting or bleeding.  She does some intermittent nausea.  Past Medical History:  Diagnosis Date  . Abscess of perineum   . Anemia   . Arthritis    "shoulders, ankles, back" (02/23/2016)  . Asthma   . COPD (chronic obstructive pulmonary disease) ([place])   . Depression   . Diarrhea age 54  . Diverticulitis SEP 2015   PERFORATED, SIGMOID COLON  . GERD (gastroesophageal reflux disease)   . Heart murmur   . History of hiatal hernia   . HTN (hypertension)   . Hyperlipidemia   . Kidney disease    "had kidney disease when I was a child"  . OSA on CPAP 8182   nasal; uncertain setting  . Restless legs syndrome    Past Surgical History:  Procedure Laterality Date  . ABSCESS DRAINAGE  ~ 02/20/2016   into my stomach; radiology  . ANTERIOR AND POSTERIOR REPAIR     "w/my bladder tack"  . CERVICAL CONE BIOPSY    . COLONOSCOPY N/A 04/05/2014   XHB:[id number] HH/moderate diverticulosis  . COLONOSCOPY WITH PROPOFOL N/A 04/23/2016   Procedure: COLONOSCOPY WITH PROPOFOL;  Surgeon: Danie Binder, MD;  Location: [facility] ENDO SUITE;  Service: Endoscopy;  Laterality: N/A;  1030  . 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 ESOPHAGOGASTRODUODENOSCOPY N/A 04/05/2014   VEL:FYBOFBPZ gastric polyps/mild erosive  gastritis  . INCONTINENCE SURGERY     bladder tack  . IR GENERIC HISTORICAL  03/07/2016   IR RADIOLOGIST EVAL & MGMT 03/07/2016 [facility] INTERV RAD  . POLYPECTOMY  04/23/2016   Procedure: POLYPECTOMY;  Surgeon: Danie Binder, MD;  Location: [facility] ENDO SUITE;  Service: Endoscopy;;  colon  . TUBAL LIGATION        Allergies: Dilaudid [hydromorphone hcl]; Hydromorphone; Metronidazole; Penicillins; Sulfa antibiotics; Vancomycin; Benzalkonium chloride; Cortisone; Crestor [rosuvastatin calcium]; Daptomycin; Neosporin [neomycin-bacitracin zn-polymyx]; Nsaids; and Rosuvastatin  Medications: Prior to Admission medications   Medication Sig Start Date End Date Taking? Authorizing Provider  albuterol (PROVENTIL HFA;VENTOLIN HFA) 108 (90 BASE) MCG/ACT inhaler Inhale 2 puffs into the lungs every 6 (six) hours as needed for wheezing or shortness of breath.    Yes [provider]  atorvastatin (LIPITOR) 20 MG tablet Take 20 mg by mouth daily.   Yes [provider]  CALCIUM-VITAMIN D PO Take 1 tablet by mouth daily.   Yes [provider]  dicyclomine (BENTYL) 20 MG tablet Take 20 mg by mouth 4 (four) times daily as needed (for abdominal pain/cramping).  01/10/16  Yes [provider]  Fluticasone-Umeclidin-Vilant (TRELEGY ELLIPTA) 100-62.5-25 MCG/INH AEPB Inhale 1 puff into the lungs daily.   Yes [provider]  losartan (COZAAR) 50 MG tablet Take 50 mg by mouth daily.   Yes [provider]  MAGNESIUM PO Take 1 tablet by mouth daily.   Yes [provider]  nebivolol (BYSTOLIC) 5 MG tablet  Take 5 mg by mouth daily.   Yes [provider]  ondansetron (ZOFRAN-ODT) 4 MG disintegrating tablet Take 4 mg by mouth every 8 (eight) hours as needed for nausea or vomiting.   Yes [provider]  pantoprazole (PROTONIX) 40 MG tablet Take 40 mg by mouth daily.   Yes [provider]  Polyvinyl Alcohol (LUBRICANT DROPS OP) Place 1-2 drops into  both eyes 3 (three) times daily as needed (for dry eyes.).   Yes [provider]  pramipexole (MIRAPEX) 0.5 MG tablet Take 0.5 mg by mouth every evening.    Yes [provider]  venlafaxine XR (EFFEXOR-XR) 150 MG 24 hr capsule Take 150 mg by mouth daily with breakfast.   Yes [provider]  Vitamin D, Ergocalciferol, (DRISDOL) 50000 units CAPS capsule Take 50,000 Units by mouth every Thursday. 03/26/16  Yes [provider]     Vital Signs: BP 118/81 (BP Location: Left Arm)   Pulse 90   Temp 98.9 F (37.2 C) (Oral)   Resp 20   Ht 5' (1.524 m)   Wt 292 lb (132.5 kg) Comment: Pt reported  LMP  (LMP Unknown) Comment: menopausal  SpO2 90%   BMI 57.03 kg/m   Physical Exam awake, alert.  Chest clear to auscultation bilaterally.  Heart with regular rate and rhythm.  Abdomen obese, soft, left colostomy intact, mild to moderate left lateral abdominal tenderness to palpation; extremities with full range of motion  Imaging: Ct Abdomen Pelvis W Contrast  Result Date: 12/06/2017 CLINICAL DATA:  Follow-up left lateral abdominal wall abscess. EXAM: CT ABDOMEN AND PELVIS WITH CONTRAST TECHNIQUE: Multidetector CT imaging of the abdomen and pelvis was performed using the standard protocol following bolus administration of intravenous contrast. CONTRAST:  127mL OMNIPAQUE IOHEXOL 300 MG/ML  SOLN COMPARISON:  CT abdomen pelvis dated December 02, 2017. FINDINGS: Lower chest: No acute abnormality. Hepatobiliary: No focal liver abnormality. Unchanged tiny gallstones. No gallbladder wall thickening or biliary dilatation. Pancreas: Unremarkable. No pancreatic ductal dilatation or surrounding inflammatory changes. Spleen: Normal in size without focal abnormality. Adrenals/Urinary Tract: The adrenal glands are unremarkable. Unchanged left renal cyst. No renal or ureteral calculi. No hydronephrosis. The bladder is unremarkable. Stomach/Bowel: Unchanged partial left colectomy with left  upper quadrant colostomy. Normal appendix. The stomach and small bowel are unremarkable. Vascular/Lymphatic: No significant vascular findings are present. No enlarged abdominal or pelvic lymph nodes. Reproductive: Uterus and bilateral adnexa are unremarkable. Other: Interval enlargement of the rim enhancing fluid collection along the peritoneal surface of the left lateral abdominal wall, now measuring 3.8 x 2.4 x 5.6 cm, previously 2.9 x 1.4 x 5.0 cm. No free fluid or pneumoperitoneum. Unchanged diastasis of the ventral abdominal wall containing non-dilated loops of small bowel. Musculoskeletal: No acute or significant osseous findings. IMPRESSION: 1. Enlarging abscess along the peritoneal surface of the left lateral abdominal wall, now measuring 3.8 x 2.4 x 5.6 cm, previously 2.9 x 1.4 x 5.0 cm. 2. Unchanged cholelithiasis. Electronically Signed   By: Titus Dubin M.D.   On: 12/06/2017 19:00    Labs:  CBC: Recent Labs    12/02/17 1455 12/04/17 0632 12/05/17 0226  WBC 12.4* 8.2 7.5  HGB 11.9* 10.5* 10.7*  HCT 37.9 35.2* 34.2*  PLT 312 334 330    COAGS: Recent Labs    12/03/17 0205  INR 1.30  APTT 34    BMP: Recent Labs    12/02/17 1455 12/04/17 0632  NA 137 139  K 4.0 3.8  CL  102 108  CO2 25 26  GLUCOSE 124* 107*  BUN 15 11  CALCIUM 9.3 8.3*  CREATININE 0.82 0.75  GFRNONAA >60 >60  GFRAA >60 >60    LIVER FUNCTION TESTS: Recent Labs    12/02/17 1455 12/04/17 0632  BILITOT 1.4*  --   AST 23  --   ALT 24  --   ALKPHOS 123  --   PROT 6.6  --   ALBUMIN 3.2* 2.7*    Assessment and Plan:  Pt with prior history of perforated diverticulitis requiring left colectomy/colostomy in 2016.  Status post drainage of the left lower quadrant abscess in 2018 with subsequent drain removal.  Drain fluid cultures at the time grew MRSA;  now presents with continued left-sided abdominal discomfort and recent imaging revealing enlarging abscess along the peritoneal surface of the  left lateral abdominal wall.  Request received for image guided aspiration/possible drainage of abscess.  Latest imaging studies have been reviewed by Dr. Earleen Newport.  Area in question appears too small to place drainage catheter, however aspiration could be attempted.  Details/risks of procedure, including not limited to, internal bleeding, infection, injury to adjacent structures discussed with patient with her understanding and consent.  Procedure scheduled for today   Electronically Signed: D. Rowe Robert, PA-C 12/07/2017, 10:25 AM   I spent a total of 25 minutes at the the patient's bedside AND on the patient's hospital floor or unit, greater than 50% of which was counseling/coordinating care for image guided aspiration/possible drainage of left lateral abdominal wall abscess    Patient ID: [name] [name], female   DOB: 17-Jan-1961, 57 y.o.   MRN: [id number]

## 2017-12-07 NOTE — Progress Notes (Signed)
[facility] Progress Note     Subjective: CC: abdominal pain Patient reports abdominal pain in L abdomen radiating all the way to the L side as well. Occasional nausea. Having bowel movements. Tearful this AM and frustrated with progress.   Objective: Vital signs in last 24 hours: Temp:  [97.7 F (36.5 C)-98.6 F (37 C)] 98.2 F (36.8 C) (11/02 2348) Pulse Rate:  [79-99] 99 (11/02 2348) Resp:  [18-20] 20 (11/02 2348) BP: (118-130)/(70-82) 130/70 (11/02 2348) SpO2:  [93 %-100 %] 93 % (11/02 2348) Last BM Date: 12/05/17  Intake/Output from previous day: 11/02 0701 - 11/03 0700 In: 800 [P.O.:800] Out: -  Intake/Output this shift: No intake/output data recorded.  PE: Gen:  Alert, NAD, pleasant Card:  Regular rate and rhythm Pulm:  Normal effort, clear to auscultation bilaterally Abd: Soft, TTP in LLQ and L side, no rebound or guarding, non-distended, bowel sounds present, colostomy with output Skin: warm and dry, no rashes  Psych: A&Ox3   Lab Results:  Recent Labs    12/05/17 0226  WBC 7.5  HGB 10.7*  HCT 34.2*  PLT 330   BMET No results for input(s): NA, K, CL, CO2, GLUCOSE, BUN, CREATININE, CALCIUM in the last 72 hours. PT/INR No results for input(s): LABPROT, INR in the last 72 hours. CMP     Component Value Date/Time   NA 139 12/04/2017 0632   K 3.8 12/04/2017 0632   CL 108 12/04/2017 0632   CO2 26 12/04/2017 0632   GLUCOSE 107 (H) 12/04/2017 0632   BUN 11 12/04/2017 0632   CREATININE 0.75 12/04/2017 0632   CALCIUM 8.3 (L) 12/04/2017 0632   PROT 6.6 12/02/2017 1455   ALBUMIN 2.7 (L) 12/04/2017 0632   AST 23 12/02/2017 1455   ALT 24 12/02/2017 1455   ALKPHOS 123 12/02/2017 1455   BILITOT 1.4 (H) 12/02/2017 1455   GFRNONAA >60 12/04/2017 0632   GFRAA >60 12/04/2017 5400   Lipase  No results found for: LIPASE     Studies/Results: Ct Abdomen Pelvis W Contrast  Result Date: 12/06/2017 CLINICAL DATA:  Follow-up left lateral abdominal  wall abscess. EXAM: CT ABDOMEN AND PELVIS WITH CONTRAST TECHNIQUE: Multidetector CT imaging of the abdomen and pelvis was performed using the standard protocol following bolus administration of intravenous contrast. CONTRAST:  171mL OMNIPAQUE IOHEXOL 300 MG/ML  SOLN COMPARISON:  CT abdomen pelvis dated December 02, 2017. FINDINGS: Lower chest: No acute abnormality. Hepatobiliary: No focal liver abnormality. Unchanged tiny gallstones. No gallbladder wall thickening or biliary dilatation. Pancreas: Unremarkable. No pancreatic ductal dilatation or surrounding inflammatory changes. Spleen: Normal in size without focal abnormality. Adrenals/Urinary Tract: The adrenal glands are unremarkable. Unchanged left renal cyst. No renal or ureteral calculi. No hydronephrosis. The bladder is unremarkable. 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 Normal appendix. The stomach and small bowel are unremarkable. Vascular/Lymphatic: No significant vascular findings are present. No enlarged abdominal or pelvic lymph nodes. Reproductive: Uterus and bilateral adnexa are unremarkable. Other: Interval enlargement of the rim enhancing fluid collection along the peritoneal surface of the left lateral abdominal wall, now measuring 3.8 x 2.4 x 5.6 cm, previously 2.9 x 1.4 x 5.0 cm. No free fluid or pneumoperitoneum. Unchanged diastasis of the ventral abdominal wall containing non-dilated loops of small bowel. Musculoskeletal: No acute or significant osseous findings. IMPRESSION: 1. Enlarging abscess along the peritoneal surface of the left lateral abdominal wall, now measuring 3.8 x 2.4 x 5.6 cm, previously 2.9 x 1.4 x 5.0 cm.  2. Unchanged cholelithiasis. Electronically Signed   By: Titus Dubin M.D.   On: 12/06/2017 19:00    Anti-infectives: Anti-infectives (From admission, onward)   Start     Dose/Rate Route Frequency Ordered Stop   12/04/17 2200  doxycycline (VIBRA-TABS) tablet 100 mg     100  mg Oral 2 times daily 12/04/17 1346     12/04/17 2000  DAPTOmycin (CUBICIN) 500 mg in sodium chloride 0.9 % IVPB  Status:  Discontinued     500 mg 220 mL/hr over 30 Minutes Intravenous Daily 12/03/17 1329 12/04/17 1344   12/04/17 2000  ciprofloxacin (CIPRO) tablet 500 mg     500 mg Oral 2 times daily 12/04/17 1346     12/03/17 0215  DAPTOmycin (CUBICIN) 482 mg in sodium chloride 0.9 % IVPB     6 mg/kg  80.3 kg (Adjusted) 219.3 mL/hr over 30 Minutes Intravenous Daily 12/03/17 0202 12/03/17 2114   12/02/17 2315  doxycycline (VIBRAMYCIN) 100 mg in sodium chloride 0.9 % 250 mL IVPB  Status:  Discontinued     100 mg 125 mL/hr over 120 Minutes Intravenous Every 12 hours 12/02/17 2304 12/03/17 0153   12/02/17 2230  piperacillin-tazobactam (ZOSYN) IVPB 3.375 g  Status:  Discontinued     3.375 g 12.5 mL/hr over 240 Minutes Intravenous Every 8 hours 12/02/17 2211 12/04/17 1344   12/02/17 2200  clindamycin (CLEOCIN) IVPB 600 mg     600 mg 100 mL/hr over 30 Minutes Intravenous  Once 12/02/17 2146 12/02/17 2249       Assessment/Plan Abdominal fluid collection -IR initially unable to aspirate this fluid collection for cultures. Recommend continued conservative management with abx therapy. -previous collection site required a drain and grew MRSA in January of 2018.  -Repeat CT 11/2 - enlarging abscess, IR re-eval for possible drainage  FEN -NPO for possible IR procedure VTE -SCDs ID -Cipro/Doxy  LOS: 5 days    Brigid Re , [facility] 12/07/2017, 7:34 AM Pager: [phone] Consults: [phone] Mon-Fri 7:00 am-4:30 pm Sat-Sun 7:00 am-11:30 am

## 2017-12-07 NOTE — Progress Notes (Signed)
PROGRESS NOTE   [name] [name]  TGG:[id number]    DOB: 02-26-1960    DOA: 12/02/2017  PCP: Moshe Cipro, MD   I have briefly reviewed patients previous medical records in [facility].  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  IR and CCS consulted.  Abscess too small for IR drainage.  Empirically placed on IV daptomycin and Zosyn.  Since clinically slowly improving, no fevers, leukocytosis and unremarkable clinical exam, transitioned to oral Cipro and doxycycline on 10/31.  Due to ongoing abdominal pain that was not improving with conservative measures, CT abdomen repeated 11/2 and shows enlarging abscess.  IR consulted 11/3 for possible aspiration/drain placement.   Assessment & Plan:   Principal Problem:   Peritoneal abscess ([place]) Active Problems:   OSA on CPAP   Essential hypertension   Intra-abdominal abscess: CT abdomen showed 1.4 x 2.9 x 5 cm subcu fluid collection along the left abdominal wall peritoneal surface with surrounding inflammatory change.  This is at the same location of her previous abscess in January 2018 which was larger, required IR drain and antibiotics as indicated above.  IR and CCS consulted.  As per IR, too small to drain percutaneously.  No surgical indication at this time.  Was empirically started on IV Zosyn and daptomycin (MRSA in the past, vancomycin allergy).  MRSA PCR negative.  Blood cultures x2: Negative to date.    I discussed in detail with infectious MD on call 10/31 who recommended transitioning to oral  doxycycline + Cipro for 2-4 weeks depending on when she gets repeat imaging to ensure resolution of abscess. Changed Abx as above.  Due to ongoing abdominal pain that was not improving with conservative measures, CT abdomen repeated 11/2 and shows enlarging abscess (now measuring 3.8 x 2.4 x 5.6 cm, previously 2.9 x 1.4 x 5.0 cm.). IR consulted 11/3 for possible aspiration/drain placement.  As per IR, collection seems to small for drain but will attempt image guided aspiration/possible drain. NPO now.  Discussed with CCS.  Essential hypertension: Was hypotensive on admission and resuscitated with IV fluids.  Holding antihypertensives.  Reasonable inpatient control.  Hyperlipidemia: Continue atorvastatin.  GERD: Asymptomatic.  Continue PPI.  Asthma/COPD/OSA on CPAP: Stable without clinical bronchospasm.  Declining use of CPAP.  Continue prior home medications.  Depression: Stable.  Continue venlafaxine.  Anemia of chronic disease: Stable.  Status post colostomy.  Hypoglycemia: CBG 60 on 10/31 morning.  No further hypoglycemic episodes.  DC CBGs  Morbid obesity/Body mass index is 57.03 kg/m.    DVT prophylaxis: SCDs.  Added Lovenox. Code Status: Full Family Communication: None at bedside Disposition:  pending review of CT abdomen results.   Consultants:  General surgery IR  Procedures:  None  Antimicrobials:  IV daptomycin and Zosyn-discontinued. Oral Cipro and doxycycline 10/31 >   Subjective: Ongoing lower mid abdominal pain and left flank pain, 7/10 in severity.  ROS: As above, otherwise negative.  Objective:  Vitals:   12/06/17 1207 12/06/17 1700 12/06/17 2348 12/07/17 0800  BP: 119/78 124/82 130/70 118/81  Pulse: 79 92 99 90  Resp: 18 18 20    Temp:  98.1 F (36.7 C) 98.6 F (37 C) 98.2 F (36.8 C) 98.9 F (37.2 C)  TempSrc: Oral Oral Oral Oral  SpO2: 96% 98% 93% 90%  Weight:      Height:        Examination:  General exam: Pleasant young female,  moderately built and morbidly obese, lying comfortably propped up in bed.  Does not appear in any distress Respiratory system: Clear to auscultation. Respiratory effort normal.  Stable Cardiovascular system: S1 & S2 heard, RRR. No JVD, murmurs, rubs, gallops or clicks. No pedal edema. Gastrointestinal system: Abdomen is nondistended, soft.  Mild left lower and mid quadrant tenderness without peritoneal signs.  No organomegaly or masses appreciated.  Colostomy functioning well.  Normal bowel sounds heard. Central nervous system: Alert and oriented. No focal neurological deficits. Extremities: Symmetric 5 x 5 power. Skin: No rashes, lesions or ulcers Psychiatry: Judgement and insight appear normal. Mood & affect appropriate.     Data Reviewed: I have personally reviewed following labs and imaging studies  CBC: Recent Labs  Lab 12/02/17 1455 12/04/17 0632 12/05/17 0226  WBC 12.4* 8.2 7.5  NEUTROABS 9.6* 5.7  --   HGB 11.9* 10.5* 10.7*  HCT 37.9 35.2* 34.2*  MCV 85.0 85.9 84.7  PLT 312 334 245   Basic Metabolic Panel: Recent Labs  Lab 12/02/17 1455 12/04/17 0632  NA 137 139  K 4.0 3.8  CL 102 108  CO2 25 26  GLUCOSE 124* 107*  BUN 15 11  CREATININE 0.82 0.75  CALCIUM 9.3 8.3*  MG  --  2.3  PHOS  --  3.6   Liver Function Tests: Recent Labs  Lab 12/02/17 1455 12/04/17 0632  AST 23  --   ALT 24  --   ALKPHOS 123  --   BILITOT 1.4*  --   PROT 6.6  --   ALBUMIN 3.2* 2.7*   Coagulation Profile: Recent Labs  Lab 12/03/17 0205  INR 1.30   Cardiac Enzymes: Recent Labs  Lab 12/04/17 0632  CKTOTAL 52   HbA1C: No results for input(s): HGBA1C in the last 72 hours. CBG: Recent Labs  Lab 12/06/17 0748 12/06/17 1210 12/06/17 1704 12/06/17 2125 12/07/17 0739  GLUCAP 81 89 107* 114* 93    Recent Results (from the past 240 hour(s))  Culture, blood (routine x 2)     Status: None (Preliminary result)   Collection Time: 12/03/17  1:59 AM  Result Value Ref Range  Status   Specimen Description BLOOD RIGHT ANTECUBITAL  Final   Special Requests   Final    BOTTLES DRAWN AEROBIC AND ANAEROBIC Blood Culture adequate volume   Culture   Final    NO GROWTH 3 DAYS Performed at [facility], [address] [place], [place] [address]    Report Status PENDING  Incomplete  Culture, blood (routine x 2)     Status: None (Preliminary result)   Collection Time: 12/03/17  2:08 AM  Result Value Ref Range Status   Specimen Description BLOOD RIGHT FOREARM  Final   Special Requests   Final    BOTTLES DRAWN AEROBIC ONLY Blood Culture results may not be optimal due to an inadequate volume of blood received in culture bottles   Culture   Final    NO GROWTH 3 DAYS Performed at [facility], [place] [address] [place], [place] [address]    Report Status PENDING  Incomplete  MRSA PCR Screening     Status: None  Collection Time: 12/03/17  1:00 PM  Result Value Ref Range Status   MRSA by PCR NEGATIVE NEGATIVE Final    Comment:        The GeneXpert MRSA Assay (FDA approved for NASAL specimens only), is one component of a comprehensive MRSA colonization surveillance program. It is not intended to diagnose MRSA infection nor to guide or monitor treatment for MRSA infections. Performed at [facility], [place] [address] [place], [place] [address]          Radiology Studies: Ct Abdomen Pelvis W Contrast  Result Date: 12/06/2017 CLINICAL DATA:  Follow-up left lateral abdominal wall abscess. EXAM: CT ABDOMEN AND PELVIS WITH CONTRAST TECHNIQUE: Multidetector CT imaging of the abdomen and pelvis was performed using the standard protocol following bolus administration of intravenous contrast. CONTRAST:  182mL OMNIPAQUE IOHEXOL 300 MG/ML  SOLN COMPARISON:  CT abdomen pelvis dated December 02, 2017. FINDINGS: Lower chest: No acute abnormality. Hepatobiliary: No focal liver abnormality. Unchanged tiny gallstones. No gallbladder wall thickening or biliary  dilatation. Pancreas: Unremarkable. No pancreatic ductal dilatation or surrounding inflammatory changes. Spleen: Normal in size without focal abnormality. Adrenals/Urinary Tract: The adrenal glands are unremarkable. Unchanged left renal cyst. No renal or ureteral calculi. No hydronephrosis. The bladder is unremarkable. 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 Normal appendix. The stomach and small bowel are unremarkable. Vascular/Lymphatic: No significant vascular findings are present. No enlarged abdominal or pelvic lymph nodes. Reproductive: Uterus and bilateral adnexa are unremarkable. Other: Interval enlargement of the rim enhancing fluid collection along the peritoneal surface of the left lateral abdominal wall, now measuring 3.8 x 2.4 x 5.6 cm, previously 2.9 x 1.4 x 5.0 cm. No free fluid or pneumoperitoneum. Unchanged diastasis of the ventral abdominal wall containing non-dilated loops of small bowel. Musculoskeletal: No acute or significant osseous findings. IMPRESSION: 1. Enlarging abscess along the peritoneal surface of the left lateral abdominal wall, now measuring 3.8 x 2.4 x 5.6 cm, previously 2.9 x 1.4 x 5.0 cm. 2. Unchanged cholelithiasis. Electronically Signed   By: Titus Dubin M.D.   On: 12/06/2017 19:00        Scheduled Meds: . atorvastatin  20 mg Oral Daily  . ciprofloxacin  500 mg Oral BID  . doxycycline  100 mg Oral BID  . fluticasone furoate-vilanterol  1 puff Inhalation Daily   And  . umeclidinium bromide  1 puff Inhalation Daily  . pantoprazole  40 mg Oral Daily  . pramipexole  0.5 mg Oral QPM  . venlafaxine XR  150 mg Oral Q breakfast  . Vitamin D (Ergocalciferol)  50,000 Units Oral Q Thu   Continuous Infusions:    LOS: 5 days     Vernell Leep, MD, FACP, [facility]. [facility] Pager [phone]  If 7PM-7AM, please contact night-coverage [email or web address] Password [email or web address] 12/07/2017, 11:25 AM

## 2017-12-07 NOTE — Procedures (Signed)
Interventional Radiology Procedure Note  Procedure: US guided aspiration of small left abd wall abscess.  ~40cc of purulent material aspirated.  Complications: None Recommendations:  - Routine wound care - Do not submerge for 7 days - Routine care - follow up culture   Signed,  Dulcy Fanny. Earleen Newport, DO

## 2017-12-08 DIAGNOSIS — L02211 Cutaneous abscess of abdominal wall: Secondary | ICD-10-CM

## 2017-12-08 LAB — GLUCOSE, CAPILLARY
GLUCOSE-CAPILLARY: 118 mg/dL — AB (ref 70–99)
GLUCOSE-CAPILLARY: 131 mg/dL — AB (ref 70–99)
Glucose-Capillary: 102 mg/dL — ABNORMAL HIGH (ref 70–99)
Glucose-Capillary: 113 mg/dL — ABNORMAL HIGH (ref 70–99)

## 2017-12-08 NOTE — Progress Notes (Signed)
Patient refused CPAP for tonight. Patient states she cannot tolerate our CPAP mask. RT encouraged patient to have her home mask brought in. RT informed patient to have RT called if she decides to wear CPAP tonight. RT will monitor as needed.

## 2017-12-08 NOTE — Progress Notes (Signed)
[facility]S/[facility]yer Asato Progress Note    Subjective: Patient doing well after aspiration of LLQ fluid collection.  No distress  Objective: Vital signs in last 24 hours: Temp:  [98.1 F (36.7 C)-99.2 F (37.3 C)] 98.1 F (36.7 C) (11/04 0317) Pulse Rate:  [98-101] 98 (11/03 2329) BP: (120-141)/(79-95) 124/81 (11/04 0317) SpO2:  [90 %-93 %] 93 % (11/04 0727) Last BM Date: 12/05/17  Intake/Output from previous day: 11/03 0701 - 11/04 0700 In: 240 [P.O.:240] Out: -  Intake/Output this shift: No intake/output data recorded.  General: No acute distress.  Still having some pain  Lungs: Clear  Abd: Soft, tender on the left where fludi collection was drained  Extremities: No changes  Neuro: Intact  Lab Results:  @LABLAST2 (wbc:2,hgb:2,hct:2,plt:2) BMET )No results for input(s): NA, K, CL, CO2, GLUCOSE, BUN, CREATININE, CALCIUM in the last 72 hours. PT/INR No results for input(s): LABPROT, INR in the last 72 hours. ABG No results for input(s): PHART, HCO3 in the last 72 hours.  Invalid input(s): PCO2, PO2  Studies/Results: US Guided Needle Placement  Result Date: 12/07/2017 INDICATION: 57 year old female with a history of abdominal wall abscess. EXAM: IR ULTRASOUND GUIDED ASPIRATION/DRAINAGE MEDICATIONS: The patient is currently admitted to the hospital and receiving intravenous antibiotics. The antibiotics were administered within an appropriate time frame prior to the initiation of the procedure. ANESTHESIA/SEDATION: None COMPLICATIONS: None PROCEDURE: Informed written consent was obtained from the patient after a thorough discussion of the procedural risks, benefits and alternatives. All questions were addressed. Maximal Sterile Barrier Technique was utilized including caps, mask, sterile gowns, sterile gloves, sterile drape, hand hygiene and skin antiseptic. A timeout was performed prior to the initiation of the procedure. Patient positioned supine position on the ultrasound table. Images  were stored sent to PACs. Patient is prepped and draped in usual sterile fashion. 1% lidocaine was used for local anesthesia. Using ultrasound guidance, a Yueh needle was advanced into hypoechoic fluid collection of the abdominal wall. Approximately 40 cc appearing material was aspirated to collapse the cavity. Needle was removed. Durable dressing was placed. Sample was sent to the lab for analysis. Patient tolerated the procedure well and remained hemodynamically stable throughout. No complications were encountered and no significant blood loss. IMPRESSION: Status post ultrasound-guided aspiration of left abdominal wall abscess with 40 cc of purulent material removed. Sample was sent for lab. Signed, Dulcy Fanny. Dellia Nims, RPVI [facility] Electronically Signed   By: Corrie Mckusick D.O.   On: 12/07/2017 13:16   Ct Abdomen Pelvis W Contrast  Result Date: 12/06/2017 CLINICAL DATA:  Follow-up left lateral abdominal wall abscess. EXAM: CT ABDOMEN AND PELVIS WITH CONTRAST TECHNIQUE: Multidetector CT imaging of the abdomen and pelvis was performed using the standard protocol following bolus administration of intravenous contrast. CONTRAST:  15mL OMNIPAQUE IOHEXOL 300 MG/ML  SOLN COMPARISON:  CT abdomen pelvis dated December 02, 2017. FINDINGS: Lower chest: No acute abnormality. Hepatobiliary: No focal liver abnormality. Unchanged tiny gallstones. No gallbladder wall thickening or biliary dilatation. Pancreas: Unremarkable. No pancreatic ductal dilatation or surrounding inflammatory changes. Spleen: Normal in size without focal abnormality. Adrenals/Urinary Tract: The adrenal glands are unremarkable. Unchanged left renal cyst. No renal or ureteral calculi. No hydronephrosis. The bladder is unremarkable. 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 Normal appendix. The stomach and small bowel are unremarkable.  Vascular/Lymphatic: No significant vascular findings are present. No enlarged abdominal or pelvic lymph nodes. Reproductive: Uterus and bilateral adnexa are unremarkable. Other: Interval enlargement of the  rim enhancing fluid collection along the peritoneal surface of the left lateral abdominal wall, now measuring 3.8 x 2.4 x 5.6 cm, previously 2.9 x 1.4 x 5.0 cm. No free fluid or pneumoperitoneum. Unchanged diastasis of the ventral abdominal wall containing non-dilated loops of small bowel. Musculoskeletal: No acute or significant osseous findings. IMPRESSION: 1. Enlarging abscess along the peritoneal surface of the left lateral abdominal wall, now measuring 3.8 x 2.4 x 5.6 cm, previously 2.9 x 1.4 x 5.0 cm. 2. Unchanged cholelithiasis. Electronically Signed   By: Titus Dubin M.D.   On: 12/06/2017 19:00    Anti-infectives: Anti-infectives (From admission, onward)   Start     Dose/Rate Route Frequency Ordered Stop   12/04/17 2200  doxycycline (VIBRA-TABS) tablet 100 mg     100 mg Oral 2 times daily 12/04/17 1346     12/04/17 2000  DAPTOmycin (CUBICIN) 500 mg in sodium chloride 0.9 % IVPB  Status:  Discontinued     500 mg 220 mL/hr over 30 Minutes Intravenous Daily 12/03/17 1329 12/04/17 1344   12/04/17 2000  ciprofloxacin (CIPRO) tablet 500 mg     500 mg Oral 2 times daily 12/04/17 1346     12/03/17 0215  DAPTOmycin (CUBICIN) 482 mg in sodium chloride 0.9 % IVPB     6 mg/kg  80.3 kg (Adjusted) 219.3 mL/hr over 30 Minutes Intravenous Daily 12/03/17 0202 12/03/17 2114   12/02/17 2315  doxycycline (VIBRAMYCIN) 100 mg in sodium chloride 0.9 % 250 mL IVPB  Status:  Discontinued     100 mg 125 mL/hr over 120 Minutes Intravenous Every 12 hours 12/02/17 2304 12/03/17 0153   12/02/17 2230  piperacillin-tazobactam (ZOSYN) IVPB 3.375 g  Status:  Discontinued     3.375 g 12.5 mL/hr over 240 Minutes Intravenous Every 8 hours 12/02/17 2211 12/04/17 1344   12/02/17 2200  clindamycin (CLEOCIN) IVPB 600 mg      600 mg 100 mL/hr over 30 Minutes Intravenous  Once 12/02/17 2146 12/02/17 2249      Assessment/Plan: s/p  Advance diet  No real role for surgery at this time. Needs to identify bacteria on recent aspiration culture and place patient on the appropriate antibiotic FOllow up with surgeon listed previously.  We will sign off  LOS: 6 days   Kathryne Eriksson. Dahlia Bailiff, MD, FACS [phone] [facility] 12/08/2017

## 2017-12-08 NOTE — Progress Notes (Addendum)
PROGRESS NOTE   [name] [name]  JSE:[id number]    DOB: 03/19/1960    DOA: 12/02/2017  PCP: Moshe Cipro, MD   I have briefly reviewed patients previous medical records in [facility].  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  IR and CCS consulted.  Abscess too small for IR drainage.  Empirically placed on IV daptomycin and Zosyn.  Since clinically slowly improving, no fevers, leukocytosis and unremarkable clinical exam, transitioned to oral Cipro and doxycycline on 10/31.  Due to ongoing abdominal pain that was not improving with conservative measures, CT abdomen repeated 11/2 and showed enlarging abscess.  IR consulted 11/3 underwent 40 mL plus aspiration.  Cultures pending.   Assessment & Plan:   Principal Problem:   Peritoneal abscess ([place]) Active Problems:   OSA on CPAP   Essential hypertension   Intra-abdominal abscess (left deep abdominal wall abscess): CT abdomen showed 1.4 x 2.9 x 5 cm subcu fluid collection along the left abdominal wall peritoneal surface with surrounding inflammatory change.  This is at the same location of her previous abscess in January 2018 which was larger, required IR drain and antibiotics as indicated above.  IR and CCS consulted.  As per IR, too small to drain percutaneously.  No surgical indication at this time.  Was empirically started on IV Zosyn and daptomycin (MRSA in the past, vancomycin allergy).  MRSA PCR negative.  Blood cultures x2: Negative to date.    I discussed in detail with infectious MD on  call 10/31 who recommended transitioning to oral doxycycline + Cipro for 2-4 weeks depending on when she gets repeat imaging to ensure resolution of abscess. Changed Abx as above.  Due to ongoing abdominal pain that was not improving with conservative measures, CT abdomen repeated 11/2 and shows enlarging abscess (now measuring 3.8 x 2.4 x 5.6 cm, previously 2.9 x 1.4 x 5.0 cm.). IR consulted 11/3 and status post ultrasound-guided aspiration of small left abdominal wall abscess/40 mL of purulent material aspirated.  Gram stain shows rare gram-positive cocci.  Follow final culture results.  Continue current Cipro and Flagyl.  Improving.  CCS signed off 11/4.  Possible DC home in the next 1-2 days pending culture sensitivity results with close outpatient follow-up with her primary surgeon.  Essential hypertension: Was hypotensive on admission and resuscitated with IV fluids.  Holding antihypertensives.  Reasonable inpatient control.  Hyperlipidemia: Continue atorvastatin.  GERD: Asymptomatic.  Continue PPI.  Asthma/COPD/OSA on CPAP: Stable without clinical bronchospasm.  Declining use of CPAP, patient brought her home CPAP mask yesterday and may be willing to use it from 2 diet.  Continue prior home medications.  Depression: Stable.  Continue venlafaxine.  Anemia of chronic disease: Stable.  Status post colostomy.  Hypoglycemia: CBG 60 on 10/31 morning.  No further hypoglycemic episodes.  DC CBGs  Morbid obesity/Body mass index is 57.03 kg/m.  Reports some chronic unchanged dyspnea, may be related to body habitus, OSA, asthma/COPD and possible OHS.  No acute findings on exam.    DVT prophylaxis: SCDs.  Added Lovenox. Code Status: Full Family Communication: None at bedside Disposition: Possible discharge  in the next 1 to 2 days pending clinical improvement and final abscess culture sensitivity results.   Consultants:  General surgery Ultrasound-guided aspiration of small left abdominal  wall abscess by IR 11/3 Procedures:  None  Antimicrobials:  IV daptomycin and Zosyn-discontinued. Oral Cipro and doxycycline 10/31 >   Subjective: Abdominal and left flank pain better.  Rates it at 5-6/10 in severity.  Reports chronic unchanged dyspnea of several months duration.  No other complaints reported.  ROS: As above, otherwise negative.  Objective:  Vitals:   12/08/17 0038 12/08/17 0317 12/08/17 0727 12/08/17 0833  BP:  124/81  121/77  Pulse:    80  Resp:    14  Temp: 98.7 F (37.1 C) 98.1 F (36.7 C)  98.3 F (36.8 C)  TempSrc: Oral Oral  Oral  SpO2:   93% 96%  Weight:      Height:        Examination:  General exam: Pleasant young female, moderately built and morbidly obese, lying comfortably propped up in bed.  Stable Respiratory system: Clear to auscultation. Respiratory effort normal.  Stable Cardiovascular system: S1 & S2 heard, RRR. No JVD, murmurs, rubs, gallops or clicks. No pedal edema.  Stable Gastrointestinal system: Abdomen is nondistended, soft.  Minimal tenderness at abscess aspiration site where there is small area of induration at site of aspiration but no other acute findings.  No organomegaly or masses appreciated.  Colostomy functioning well.  Normal bowel sounds heard. Central nervous system: Alert and oriented. No focal neurological deficits.  Stable Extremities: Symmetric 5 x 5 power. Skin: No rashes, lesions or ulcers Psychiatry: Judgement and insight appear normal. Mood & affect appropriate.     Data Reviewed: I have personally reviewed following labs and imaging studies  CBC: Recent Labs  Lab 12/02/17 1455 12/04/17 0632 12/05/17 0226  WBC 12.4* 8.2 7.5  NEUTROABS 9.6* 5.7  --   HGB 11.9* 10.5* 10.7*  HCT 37.9 35.2* 34.2*  MCV 85.0 85.9 84.7  PLT 312 334 937   Basic Metabolic Panel: Recent Labs  Lab 12/02/17 1455 12/04/17 0632  NA 137 139  K 4.0 3.8  CL 102 108  CO2 25 26  GLUCOSE 124* 107*  BUN 15 11  CREATININE  0.82 0.75  CALCIUM 9.3 8.3*  MG  --  2.3  PHOS  --  3.6   Liver Function Tests: Recent Labs  Lab 12/02/17 1455 12/04/17 0632  AST 23  --   ALT 24  --   ALKPHOS 123  --   BILITOT 1.4*  --   PROT 6.6  --   ALBUMIN 3.2* 2.7*   Coagulation Profile: Recent Labs  Lab 12/03/17 0205  INR 1.30   Cardiac Enzymes: Recent Labs  Lab 12/04/17 0632  CKTOTAL 52   HbA1C: No results for input(s): HGBA1C in the last 72 hours. CBG: Recent Labs  Lab 12/07/17 0739 12/07/17 1339 12/07/17 1644 12/07/17 2053 12/08/17 0816  GLUCAP 93 108* 140* 135* 102*    Recent Results (from the past 240 hour(s))  Culture, blood (routine x 2)     Status: None (Preliminary result)   Collection Time: 12/03/17  1:59 AM  Result Value Ref Range Status   Specimen Description BLOOD RIGHT ANTECUBITAL  Final   Special Requests   Final    BOTTLES DRAWN AEROBIC AND ANAEROBIC Blood Culture adequate volume   Culture   Final    NO GROWTH 4 DAYS Performed at [facility], [place]  [address], [place], [place] [address]    Report Status PENDING  Incomplete  Culture, blood (routine x 2)     Status: None (Preliminary result)   Collection Time: 12/03/17  2:08 AM  Result Value Ref Range Status   Specimen Description BLOOD RIGHT FOREARM  Final   Special Requests   Final    BOTTLES DRAWN AEROBIC ONLY Blood Culture results may not be optimal due to an inadequate volume of blood received in culture bottles   Culture   Final    NO GROWTH 4 DAYS Performed at [facility], [place] [address], [place], [place] [address]    Report Status PENDING  Incomplete  MRSA PCR Screening     Status: None   Collection Time: 12/03/17  1:00 PM  Result Value Ref Range Status   MRSA by PCR NEGATIVE NEGATIVE Final    Comment:        The GeneXpert MRSA Assay (FDA approved for NASAL specimens only), is one component of a comprehensive MRSA colonization surveillance program. It is not intended to diagnose MRSA infection nor to  guide or monitor treatment for MRSA infections. Performed at [facility], [place] [address] [place], [place] [address]   Aerobic/Anaerobic Culture (surgical/deep wound)     Status: None (Preliminary result)   Collection Time: 12/07/17 12:36 PM  Result Value Ref Range Status   Specimen Description ABSCESS ABDOMEN  Final   Special Requests NONE  Final   Gram Stain   Final    ABUNDANT WBC PRESENT, PREDOMINANTLY PMN RARE GRAM POSITIVE COCCI Performed at [facility], [place] [address], [place], [place] [address]    Culture PENDING  Incomplete   Report Status PENDING  Incomplete         Radiology Studies: US Guided Needle Placement  Result Date: 12/07/2017 INDICATION: 57 year old female with a history of abdominal wall abscess. EXAM: IR ULTRASOUND GUIDED ASPIRATION/DRAINAGE MEDICATIONS: The patient is currently admitted to the hospital and receiving intravenous antibiotics. The antibiotics were administered within an appropriate time frame prior to the initiation of the procedure. ANESTHESIA/SEDATION: None COMPLICATIONS: None PROCEDURE: Informed written consent was obtained from the patient after a thorough discussion of the procedural risks, benefits and alternatives. All questions were addressed. Maximal Sterile Barrier Technique was utilized including caps, mask, sterile gowns, sterile gloves, sterile drape, hand hygiene and skin antiseptic. A timeout was performed prior to the initiation of the procedure. Patient positioned supine position on the ultrasound table. Images were stored sent to PACs. Patient is prepped and draped in usual sterile fashion. 1% lidocaine was used for local anesthesia. Using ultrasound guidance, a Yueh needle was advanced into hypoechoic fluid collection of the abdominal wall. Approximately 40 cc appearing material was aspirated to collapse the cavity. Needle was removed. Durable dressing was placed. Sample was sent to the lab for analysis. Patient tolerated  the procedure well and remained hemodynamically stable throughout. No complications were encountered and no significant blood loss. IMPRESSION: Status post ultrasound-guided aspiration of left abdominal wall abscess with 40 cc of purulent material removed. Sample was sent for lab. Signed, Dulcy Fanny. Dellia Nims, RPVI [facility] Electronically Signed   By: Corrie Mckusick D.O.   On: 12/07/2017 13:16   Ct Abdomen Pelvis W Contrast  Result Date: 12/06/2017 CLINICAL DATA:  Follow-up left lateral abdominal wall abscess. EXAM: CT ABDOMEN AND PELVIS WITH CONTRAST TECHNIQUE: Multidetector CT imaging of the abdomen and pelvis was performed using the standard  protocol following bolus administration of intravenous contrast. CONTRAST:  159mL OMNIPAQUE IOHEXOL 300 MG/ML  SOLN COMPARISON:  CT abdomen pelvis dated December 02, 2017. FINDINGS: Lower chest: No acute abnormality. Hepatobiliary: No focal liver abnormality. Unchanged tiny gallstones. No gallbladder wall thickening or biliary dilatation. Pancreas: Unremarkable. No pancreatic ductal dilatation or surrounding inflammatory changes. Spleen: Normal in size without focal abnormality. Adrenals/Urinary Tract: The adrenal glands are unremarkable. Unchanged left renal cyst. No renal or ureteral calculi. No hydronephrosis. The bladder is unremarkable. 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 Normal appendix. The stomach and small bowel are unremarkable. Vascular/Lymphatic: No significant vascular findings are present. No enlarged abdominal or pelvic lymph nodes. Reproductive: Uterus and bilateral adnexa are unremarkable. Other: Interval enlargement of the rim enhancing fluid collection along the peritoneal surface of the left lateral abdominal wall, now measuring 3.8 x 2.4 x 5.6 cm, previously 2.9 x 1.4 x 5.0 cm. No free fluid or pneumoperitoneum. Unchanged diastasis of the ventral  abdominal wall containing non-dilated loops of small bowel. Musculoskeletal: No acute or significant osseous findings. IMPRESSION: 1. Enlarging abscess along the peritoneal surface of the left lateral abdominal wall, now measuring 3.8 x 2.4 x 5.6 cm, previously 2.9 x 1.4 x 5.0 cm. 2. Unchanged cholelithiasis. Electronically Signed   By: Titus Dubin M.D.   On: 12/06/2017 19:00        Scheduled Meds: . atorvastatin  20 mg Oral Daily  . ciprofloxacin  500 mg Oral BID  . doxycycline  100 mg Oral BID  . fluticasone furoate-vilanterol  1 puff Inhalation Daily   And  . umeclidinium bromide  1 puff Inhalation Daily  . pantoprazole  40 mg Oral Daily  . pramipexole  0.5 mg Oral QPM  . venlafaxine XR  150 mg Oral Q breakfast  . Vitamin D (Ergocalciferol)  50,000 Units Oral Q Thu   Continuous Infusions:    LOS: 6 days     Vernell Leep, MD, FACP, [facility]. [facility] Pager [phone]  If 7PM-7AM, please contact night-coverage [email or web address] Password [email or web address] 12/08/2017, 10:17 AM

## 2017-12-09 LAB — GLUCOSE, CAPILLARY
GLUCOSE-CAPILLARY: 110 mg/dL — AB (ref 70–99)
Glucose-Capillary: 140 mg/dL — ABNORMAL HIGH (ref 70–99)

## 2017-12-09 MED ORDER — DOXYCYCLINE HYCLATE 100 MG PO TABS: 100.0000 mg | ORAL_TABLET | Freq: Two times a day (BID) | ORAL | 0 refills | Status: AC

## 2017-12-09 MED ORDER — HYDROCODONE-ACETAMINOPHEN 5-325 MG PO TABS: 1.0000 | ORAL_TABLET | Freq: Four times a day (QID) | ORAL | 0 refills | Status: AC | PRN

## 2017-12-09 NOTE — Care Management Note (Signed)
Case Management Note  Patient Details  Name: [name] [name] MRN: [id number] Date of Birth: 1960/04/28  Subjective/Objective:   Pt admitted with intra-abdominal abscess                 Action/Plan:  PTA independent from home with husband.  Pt has had colostomy since 2016 and is completely self sufficient - supplies are sent directly to her home.  Pt has PCP and denied barriers with paying for medications  Expected Discharge Date:  12/09/17               Expected Discharge Plan:  Home/Self Care  In-House Referral:     Discharge planning Services  CM Consult  Post Acute Care Choice:    Choice offered to:     DME Arranged:    DME Agency:     HH Arranged:    HH Agency:     Status of Service:  In process, will continue to follow  If discussed at Long Length of Stay Meetings, dates discussed:    Additional Comments: 12/09/2017  Pt to discharge home today.  Drain was not placed during this admit.  NO CM needs determined prior to discharge  12/05/17 If pt requires drain placement may need HHRN - CM will continue to follow for discharge needs Maryclare Labrador, RN 12/09/2017, 3:04 PM

## 2017-12-09 NOTE — Plan of Care (Signed)

## 2017-12-09 NOTE — Discharge Instructions (Addendum)
Incision and Drainage, Care After Refer to this sheet in the next few weeks. These instructions provide you with information about caring for yourself after your procedure. Your health care provider may also give you more specific instructions. Your treatment has been planned according to current medical practices, but problems sometimes occur. Call your health care provider if you have any problems or questions after your procedure. What can I expect after the procedure? After the procedure, it is common to have:  Pain or discomfort around your incision site.  Drainage from your incision.  Follow these instructions at home:  Take over-the-counter and prescription medicines only as told by your health care provider.  If you were prescribed an antibiotic medicine, take it as told by your health care provider.Do not stop taking the antibiotic even if you start to feel better.  Followinstructions from your health care provider about: ? How to take care of your incision. ? When and how you should change your packing and bandage (dressing). Wash your hands with soap and water before you change your dressing. If soap and water are not available, use hand sanitizer. ? When you should remove your dressing.  Do not take baths, swim, or use a hot tub until your health care provider approves.  Keep all follow-up visits as told by your health care provider. This is important.  Check your incision area every day for signs of infection. Check for: ? More redness, swelling, or pain. ? More fluid or blood. ? Warmth. ? Pus or a bad smell. Contact a health care provider if:  Your cyst or abscess returns.  You have a fever.  You have more redness, swelling, or pain around your incision.  You have more fluid or blood coming from your incision.  Your incision feels warm to the touch.  You have pus or a bad smell coming from your incision. Get help right away if:  You have severe pain or  bleeding.  You cannot eat or drink without vomiting.  You have decreased urine output.  You become short of breath.  You have chest pain.  You cough up blood.  The area where the incision and drainage occurred becomes numb or it tingles. This information is not intended to replace advice given to you by your health care provider. Make sure you discuss any questions you have with your health care provider. Document Released: 04/15/2011 Document Revised: 06/23/2015 Document Reviewed: 11/11/2014 Elsevier Interactive Patient Education  2018 [employer]. Please get your medications reviewed and adjusted by your Primary MD.  Please request your Primary MD to go over all Hospital Tests and Procedure/Radiological results at the follow up, please get all Hospital records sent to your Prim MD by signing hospital release before you go home.  If you had Pneumonia of Lung problems at the Hospital: Please get a 2 view Chest X ray done in 6-8 weeks after hospital discharge or sooner if instructed by your Primary MD.  If you have Congestive Heart Failure: Please call your Cardiologist or Primary MD anytime you have any of the following symptoms:  1) 3 pound weight gain in 24 hours or 5 pounds in 1 week  2) shortness of breath, with or without a dry hacking cough  3) swelling in the hands, feet or stomach  4) if you have to sleep on extra pillows at night in order to breathe  Follow cardiac low salt diet and 1.5 lit/day fluid restriction.  If you have diabetes  Accuchecks 4 times/day, Once in AM empty stomach and then before each meal. Log in all results and show them to your primary doctor at your next visit. If any glucose reading is under 80 or above 300 call your primary MD immediately.  If you have Seizure/Convulsions/Epilepsy: Please do not drive, operate heavy machinery, participate in activities at heights or participate in high speed sports until you have seen by Primary MD or a  Neurologist and advised to do so again.  If you had Gastrointestinal Bleeding: Please ask your Primary MD to check a complete blood count within one week of discharge or at your next visit. Your endoscopic/colonoscopic biopsies that are pending at the time of discharge, will also need to followed by your Primary MD.  Get Medicines reviewed and adjusted. Please take all your medications with you for your next visit with your Primary MD  Please request your Primary MD to go over all hospital tests and procedure/radiological results at the follow up, please ask your Primary MD to get all Hospital records sent to his/her office.  If you experience worsening of your admission symptoms, develop shortness of breath, life threatening emergency, suicidal or homicidal thoughts you must seek medical attention immediately by calling 911 or calling your MD immediately  if symptoms less severe.  You must read complete instructions/literature along with all the possible adverse reactions/side effects for all the Medicines you take and that have been prescribed to you. Take any new Medicines after you have completely understood and accpet all the possible adverse reactions/side effects.   Do not drive or operate heavy machinery when taking Pain medications.   Do not take more than prescribed Pain, Sleep and Anxiety Medications  Special Instructions: If you have smoked or chewed Tobacco  in the last 2 yrs please stop smoking, stop any regular Alcohol  and or any Recreational drug use.  Wear Seat belts while driving.  Please note You were cared for by a hospitalist during your hospital stay. If you have any questions about your discharge medications or the care you received while you were in the hospital after you are discharged, you can call the unit and asked to speak with the hospitalist on call if the hospitalist that took care of you is not available. Once you are discharged, your primary care physician  will handle any further medical issues. Please note that NO REFILLS for any discharge medications will be authorized once you are discharged, as it is imperative that you return to your primary care physician (or establish a relationship with a primary care physician if you do not have one) for your aftercare needs so that they can reassess your need for medications and monitor your lab values.  You can reach the hospitalist office at phone [phone] or fax [phone]   If you do not have a primary care physician, you can call [phone] for a physician referral.

## 2017-12-09 NOTE — Discharge Summary (Signed)
Physician Discharge Summary  [name] [name] OXB:[id number] DOB: 1960-07-20  PCP: Moshe Cipro, MD  Admit date: 12/02/2017 Discharge date: 12/09/2017  Recommendations for Outpatient Follow-up:  1. Dr. Moshe Cipro, PCP in 3 days with repeat labs (CBC & BMP).  Please follow final abscess culture results that were sent from the hospital. 2. Dr. Kennieth Rad, ID in 1 week. 3. Dr. Coralie Keens, General Surgery, as needed.  Home Health: None Equipment/Devices: None  Discharge Condition: Improved and stable CODE STATUS: Full Diet recommendation: Heart healthy diet.  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  IR and CCS consulted.  Abscess initially too small for IR drainage.  Empirically placed on IV daptomycin and Zosyn.  Since clinically slowly improving, no fevers, leukocytosis and unremarkable clinical exam, transitioned to oral Cipro and doxycycline on 10/31.  Due to ongoing abdominal pain that was not improving with conservative measures, CT abdomen repeated 11/2 and showed enlarging abscess.  IR consulted 11/3 underwent 40 mL plus aspiration.    Preliminary cultures show abundant Staphylococcus aureus, sensitivities pending.   Assessment & Plan:   Intra-abdominal abscess (left deep abdominal wall abscess): CT abdomen showed 1.4 x 2.9 x 5 cm fluid collection along the left abdominal wall peritoneal surface with  surrounding inflammatory change.  This is at the same location of her previous abscess in January 2018 which was larger, required IR drain and antibiotics as indicated above.  IR and CCS consulted.  As per IR, initially the abscess was too small to drain percutaneously.  No surgical indication at this time.  She was empirically started on IV Zosyn and daptomycin (MRSA in the past, vancomycin allergy).  MRSA PCR negative.  Blood cultures x2: Negative to date.    I after discussing with ID on call 12/04/2017, she was transitioned to oral doxycycline + Cipro.  Due to ongoing abdominal pain that was not improving with conservative measures, CT abdomen repeated 11/2 and showed enlarging abscess (now measuring 3.8 x 2.4 x 5.6 cm, previously 2.9 x 1.4 x 5.0 cm.). IR consulted 11/3 and status post ultrasound-guided aspiration of small left abdominal wall abscess/40 mL of purulent material aspirated.  She was continued on Cipro and Flagyl.  Clinically she continues to gradually improve.  Pain improved, no fever or leukocytosis.  General surgeons signed off 11/4.  Abscess preliminary cultures show abundant Staphylococcus aureus, sensitivities pending.  Reviewed abscess culture results from 02/20/2016: MRSA sensitive to tetracycline, resistant to Cipro and oxacillin.  I discussed with ID MD on call on day of discharge who recommended doxycycline alone on discharge to complete total 14 days treatment.  As per my discussion with general surgeons, she would not need repeat imaging if she clinically continues to improve.  Patient states that the surgeon who had previously operated on her no longer practices in [place].  She was provided with CCS contact details.  She was also advised to follow-up with Dr. Shearon Stalls, ID who had seen her during prior episode of MRSA abscess.  She also indicates that she has no further pain medications at home.  I reviewed the [place] controlled substance database and confirmed that she last  filled a prescription for tramadol 50 mg, 21 tablets / 7-day supply on 09/16/2017.  Essential hypertension: Was hypotensive on admission and resuscitated with IV fluids.  Her blood pressures continue to be soft or controlled off of antihypertensives.  Losartan and nebivolol have been discontinued during discharge.  Patient advised to follow-up with PCP and may consider resuming one or both of these medications depending on how her blood pressures are.  Hyperlipidemia: Continue atorvastatin.  GERD: Asymptomatic.  Continue PPI.  Asthma/COPD/OSA on CPAP: Stable without clinical bronchospasm.  Patient did not use CPAP in the hospital because she did not have her home mask.  Continue prior home medications.  Depression: Stable.  Continue venlafaxine.  Anemia of chronic disease: Stable.  Status post colostomy.  Hypoglycemia: CBG 60 on 10/31 morning.  No further hypoglycemic episodes.  DC CBGs  Morbid obesity/Body mass index is 57.03 kg/m.  Reports some chronic unchanged dyspnea, may be related to body habitus, OSA, asthma/COPD and possible OHS.  No acute findings on exam.    Consultants:  General surgery Interventional radiology  Procedures:  Ultrasound-guided aspiration of small left abdominal wall abscess by IR 11/3   Discharge Instructions  Discharge Instructions    Call MD for:  difficulty breathing, headache or visual disturbances   Complete by:  As directed    Call MD for:  extreme fatigue   Complete by:  As directed    Call MD for:  persistant dizziness or light-headedness   Complete by:  As directed    Call MD for:  persistant nausea and vomiting   Complete by:  As directed    Call MD for:  severe uncontrolled pain   Complete by:  As directed    Call MD for:  temperature >100.4   Complete by:  As directed    Diet - low sodium heart healthy   Complete by:  As directed    Increase activity slowly   Complete by:  As directed        Medication List     STOP taking these medications   losartan 50 MG tablet Commonly known as:  COZAAR   nebivolol 5 MG tablet Commonly known as:  BYSTOLIC     TAKE these medications   albuterol 108 (90 Base) MCG/ACT inhaler Commonly known as:  PROVENTIL HFA;VENTOLIN HFA Inhale 2 puffs into the lungs every 6 (six) hours as needed for wheezing or shortness of breath.   atorvastatin 20 MG tablet Commonly known as:  LIPITOR Take 20 mg by mouth daily.   CALCIUM-VITAMIN D PO Take 1 tablet by mouth daily.   dicyclomine 20 MG tablet Commonly known as:  BENTYL Take 20 mg by mouth 4 (four) times daily as needed (for abdominal pain/cramping).   doxycycline 100 MG tablet Commonly known as:  VIBRA-TABS Take 1 tablet (100 mg total) by mouth 2 (two) times daily for 7 days.   HYDROcodone-acetaminophen 5-325 MG tablet Commonly known as:  NORCO/VICODIN Take 1-2 tablets by mouth every 6 (six) hours as needed for moderate pain or severe pain.   LUBRICANT DROPS OP Place 1-2 drops into both eyes 3 (three) times daily as needed (for dry eyes.).   MAGNESIUM PO Take 1 tablet by mouth daily.   ondansetron 4 MG disintegrating tablet Commonly known as:  ZOFRAN-ODT Take 4 mg by mouth every 8 (  eight) hours as needed for nausea or vomiting.   pantoprazole 40 MG tablet Commonly known as:  PROTONIX Take 40 mg by mouth daily.   pramipexole 0.5 MG tablet Commonly known as:  MIRAPEX Take 0.5 mg by mouth every evening.   TRELEGY ELLIPTA 100-62.5-25 MCG/INH Aepb Generic drug:  Fluticasone-Umeclidin-Vilant Inhale 1 puff into the lungs daily.   venlafaxine XR 150 MG 24 hr capsule Commonly known as:  EFFEXOR-XR Take 150 mg by mouth daily with breakfast.   Vitamin D (Ergocalciferol) 50000 units Caps capsule Commonly known as:  DRISDOL Take 50,000 Units by mouth every Thursday.      Follow-up Information    Moshe Cipro, MD. Schedule an appointment as soon as possible for a visit in 3 day(s).   Specialty:   Internal Medicine Why:  To be seen with repeat labs (CBC & BMP). Contact information: [address] [place] [address] [phone]        Kennieth Rad, MD. Schedule an appointment as soon as possible for a visit in 1 week(s).   Specialties:  Internal Medicine, Infectious Diseases Contact information: [facility] [address] [place] [address] [phone]        Coralie Keens, MD. Schedule an appointment as soon as possible for a visit.   Specialty:  General Surgery Why:  As needed. Contact information: [address] [place] [address] [phone]          Allergies  Allergen Reactions  . Dilaudid [Hydromorphone Hcl] Shortness Of Breath and Other (See Comments)    Respiratory depression  . Hydromorphone Shortness Of Breath  . Metronidazole Other (See Comments)    Red Man Syndrome from IV form  . Penicillins Shortness Of Breath    (Tolerates Zosyn) Has patient had a PCN reaction causing immediate rash, facial/tongue/throat swelling, SOB or lightheadedness with hypotension: Yes Has patient had a PCN reaction causing severe rash involving mucus membranes or skin necrosis: No Has patient had a PCN reaction that required hospitalization: No  Has patient had a PCN reaction occurring within the last 10 years: No If all of the above answers are "NO", then may proceed with Cephalosporin use.  . Sulfa Antibiotics Anaphylaxis    "THROAT CLOSES UP"  . Vancomycin Nausea And Vomiting    And Red Man's Syndrome, also  . Benzalkonium Chloride Hives  . Cortisone Hives  . Crestor [Rosuvastatin Calcium] Other (See Comments)    Liver toxicity  . Daptomycin Nausea And Vomiting  . Neosporin [Neomycin-Bacitracin Zn-Polymyx] Hives  . Nsaids Other (See Comments)    Patient is not to take these because of GI bleeds  . Rosuvastatin Other (See Comments)    Liver toxicity      Procedures/Studies: US Guided Needle  Placement  Result Date: 12/07/2017 INDICATION: 57 year old female with a history of abdominal wall abscess. EXAM: IR ULTRASOUND GUIDED ASPIRATION/DRAINAGE MEDICATIONS: The patient is currently admitted to the hospital and receiving intravenous antibiotics. The antibiotics were administered within an appropriate time frame prior to the initiation of the procedure. ANESTHESIA/SEDATION: None COMPLICATIONS: None PROCEDURE: Informed written consent was obtained from the patient after a thorough discussion of the procedural risks, benefits and alternatives. All questions were addressed. Maximal Sterile Barrier Technique was utilized including caps, mask, sterile gowns, sterile gloves, sterile drape, hand hygiene and skin antiseptic. A timeout was performed prior to the initiation of the procedure. Patient positioned supine position on the ultrasound table. Images were stored sent to PACs. Patient is  prepped and draped in usual sterile fashion. 1% lidocaine was used for local anesthesia. Using ultrasound guidance, a Yueh needle was advanced into hypoechoic fluid collection of the abdominal wall. Approximately 40 cc appearing material was aspirated to collapse the cavity. Needle was removed. Durable dressing was placed. Sample was sent to the lab for analysis. Patient tolerated the procedure well and remained hemodynamically stable throughout. No complications were encountered and no significant blood loss. IMPRESSION: Status post ultrasound-guided aspiration of left abdominal wall abscess with 40 cc of purulent material removed. Sample was sent for lab. Signed, Dulcy Fanny. Dellia Nims, RPVI [facility] Radiology Electronically Signed   By: Corrie Mckusick D.O.   On: 12/07/2017 13:16   Ct Abdomen Pelvis W Contrast  Result Date: 12/06/2017 CLINICAL DATA:  Follow-up left lateral abdominal wall abscess. EXAM: CT ABDOMEN AND PELVIS WITH CONTRAST TECHNIQUE: Multidetector CT  imaging of the abdomen and pelvis was performed using the standard protocol following bolus administration of intravenous contrast. CONTRAST:  174mL OMNIPAQUE IOHEXOL 300 MG/ML  SOLN COMPARISON:  CT abdomen pelvis dated December 02, 2017. FINDINGS: Lower chest: No acute abnormality. Hepatobiliary: No focal liver abnormality. Unchanged tiny gallstones. No gallbladder wall thickening or biliary dilatation. Pancreas: Unremarkable. No pancreatic ductal dilatation or surrounding inflammatory changes. Spleen: Normal in size without focal abnormality. Adrenals/Urinary Tract: The adrenal glands are unremarkable. Unchanged left renal cyst. No renal or ureteral calculi. No hydronephrosis. The bladder is unremarkable. 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 Normal appendix. The stomach and small bowel are unremarkable. Vascular/Lymphatic: No significant vascular findings are present. No enlarged abdominal or pelvic lymph nodes. Reproductive: Uterus and bilateral adnexa are unremarkable. Other: Interval enlargement of the rim enhancing fluid collection along the peritoneal surface of the left lateral abdominal wall, now measuring 3.8 x 2.4 x 5.6 cm, previously 2.9 x 1.4 x 5.0 cm. No free fluid or pneumoperitoneum. Unchanged diastasis of the ventral abdominal wall containing non-dilated loops of small bowel. Musculoskeletal: No acute or significant osseous findings. IMPRESSION: 1. Enlarging abscess along the peritoneal surface of the left lateral abdominal wall, now measuring 3.8 x 2.4 x 5.6 cm, previously 2.9 x 1.4 x 5.0 cm. 2. Unchanged cholelithiasis. Electronically Signed   By: Titus Dubin M.D.   On: 12/06/2017 19:00   Ct Abdomen Pelvis W Contrast  Result Date: 12/02/2017 CLINICAL DATA:  Patient thinks she has abscess over left hip region for which she had to have incision and drainage in past with IV antibiotics and feels like same thing happening again. EXAM: CT ABDOMEN  AND PELVIS WITH CONTRAST TECHNIQUE: Multidetector CT imaging of the abdomen and pelvis was performed using the standard protocol following bolus administration of intravenous contrast. CONTRAST:  177mL OMNIPAQUE IOHEXOL 300 MG/ML  SOLN COMPARISON:  04/15/2016 and 02/19/2016 FINDINGS: Lower chest: Minimal right basilar atelectasis. Hepatobiliary: Single punctate gallstone near the gallbladder neck. Liver and biliary tree are normal Pancreas: Normal. Spleen: Normal. Adrenals/Urinary Tract: Adrenal glands are normal. Kidneys normal size without hydronephrosis or nephrolithiasis. 3.1 cm left renal cyst. Ureters and bladder are normal. Stomach/Bowel: Stomach and small bowel are normal. Appendix is normal. Colostomy just left of midline over the mid abdomen unchanged. Vascular/Lymphatic: Within normal. Reproductive: Punctate calcification in the region of the left uterine cornua possibly tiny fibroid. Uterus and ovaries are otherwise unremarkable. Other: Region of thickening of the left lateral abdominal wall beginning just below the ribs and extending inferiorly. There is mild stranding of the mesenteric fat  within the adjacent peritoneum. There is a subtle elliptical fluid collection along the peritoneal surface of the abdominal wall measuring approximately 1.4 x 2.9 x 5 cm in transverse, AP and craniocaudal dimensions possibly a small infected collection/abscess. Mild subcutaneous fat within the adjacent lateral abdominal wall. Musculoskeletal: Minimal degenerative change of the spine. IMPRESSION: Focal thickening along the left lateral abdominal wall with elliptical fluid collection along the peritoneal side of this thickening measuring 1.4 x 2.9 x 5 cm with adjacent inflammatory change/fluid in the mesentery. This likely represents an infected collection/abscess. Colostomy site just left of midline over the anterior abdominal wall unchanged. Minimal cholelithiasis. 3.1 cm left renal cyst. Electronically Signed   By:  Marin Olp M.D.   On: 12/02/2017 21:35      Subjective: Patient reports continued gradual improvement.  Still has some left flank, mid lower abdominal pain rated at 4-5/10 in severity, controlled with pain medications which she is using less of than before.  No nausea or vomiting, tolerating diet without difficulty.  No fever or chills.  Denies dyspnea or chest pain.  No dizziness or lightheadedness.  As per RN, no acute issues noted.  Discharge Exam:  Vitals:   12/08/17 2100 12/09/17 0735 12/09/17 0832 12/09/17 1217  BP: 123/85  109/71 114/70  Pulse:   83 83  Resp:   16 18  Temp:   98.2 F (36.8 C) 98.2 F (36.8 C)  TempSrc:   Oral Oral  SpO2: 98% 93% 95%   Weight:      Height:        General exam: Pleasant young female, moderately built and morbidly obese, lying comfortably propped up in bed.   Respiratory system: Clear to auscultation. Respiratory effort normal.   Cardiovascular system: S1 & S2 heard, RRR. No JVD, murmurs, rubs, gallops or clicks. No pedal edema.  Gastrointestinal system: Abdomen is nondistended, soft.  Minimal tenderness at abscess aspiration site where there is small area of induration at site of aspiration (site of induration is smaller today, <1 cm diameter) but no other acute findings.  No organomegaly or masses appreciated.  Colostomy functioning well.  Normal bowel sounds heard. Central nervous system: Alert and oriented. No focal neurological deficits.   Extremities: Symmetric 5 x 5 power. Skin: No rashes, lesions or ulcers Psychiatry: Judgement and insight appear normal. Mood & affect appropriate.    The results of significant diagnostics from this hospitalization (including imaging, microbiology, ancillary and laboratory) are listed below for reference.     Microbiology: Recent Results (from the past 240 hour(s))  Culture, blood (routine x 2)     Status: None (Preliminary result)   Collection Time: 12/03/17  1:59 AM  Result Value Ref Range  Status   Specimen Description BLOOD RIGHT ANTECUBITAL  Final   Special Requests   Final    BOTTLES DRAWN AEROBIC AND ANAEROBIC Blood Culture adequate volume   Culture   Final    NO GROWTH 4 DAYS Performed at [facility], [address] [place], [place] [address]    Report Status PENDING  Incomplete  Culture, blood (routine x 2)     Status: None (Preliminary result)   Collection Time: 12/03/17  2:08 AM  Result Value Ref Range Status   Specimen Description BLOOD RIGHT FOREARM  Final   Special Requests   Final    BOTTLES DRAWN AEROBIC ONLY Blood Culture results may not be optimal due to an inadequate volume of blood received in culture bottles  Culture   Final    NO GROWTH 4 DAYS Performed at [facility], [place] [address] [place], [place] [address]    Report Status PENDING  Incomplete  MRSA PCR Screening     Status: None   Collection Time: 12/03/17  1:00 PM  Result Value Ref Range Status   MRSA by PCR NEGATIVE NEGATIVE Final    Comment:        The GeneXpert MRSA Assay (FDA approved for NASAL specimens only), is one component of a comprehensive MRSA colonization surveillance program. It is not intended to diagnose MRSA infection nor to guide or monitor treatment for MRSA infections. Performed at [facility], [place] [address], [place], [place] [address]   Aerobic/Anaerobic Culture (surgical/deep wound)     Status: None (Preliminary result)   Collection Time: 12/07/17 12:36 PM  Result Value Ref Range Status   Specimen Description ABSCESS ABDOMEN  Final   Special Requests NONE  Final   Gram Stain   Final    ABUNDANT WBC PRESENT, PREDOMINANTLY PMN RARE GRAM POSITIVE COCCI    Culture   Final    ABUNDANT STAPHYLOCOCCUS AUREUS SUSCEPTIBILITIES TO FOLLOW Performed at [facility], [place] [address], [place], [place] [address]    Report Status PENDING  Incomplete     Labs: CBC: Recent Labs  Lab 12/02/17 1455 12/04/17 0632 12/05/17 0226  WBC 12.4*  8.2 7.5  NEUTROABS 9.6* 5.7  --   HGB 11.9* 10.5* 10.7*  HCT 37.9 35.2* 34.2*  MCV 85.0 85.9 84.7  PLT 312 334 696   Basic Metabolic Panel: Recent Labs  Lab 12/02/17 1455 12/04/17 0632  NA 137 139  K 4.0 3.8  CL 102 108  CO2 25 26  GLUCOSE 124* 107*  BUN 15 11  CREATININE 0.82 0.75  CALCIUM 9.3 8.3*  MG  --  2.3  PHOS  --  3.6   Liver Function Tests: Recent Labs  Lab 12/02/17 1455 12/04/17 0632  AST 23  --   ALT 24  --   ALKPHOS 123  --   BILITOT 1.4*  --   PROT 6.6  --   ALBUMIN 3.2* 2.7*   Cardiac Enzymes: Recent Labs  Lab 12/04/17 0632  CKTOTAL 52   CBG: Recent Labs  Lab 12/08/17 0816 12/08/17 1225 12/08/17 1636 12/08/17 2146 12/09/17 0828  GLUCAP 102* 118* 113* 131* 110*   Urinalysis    Component Value Date/Time   COLORURINE YELLOW 12/05/2017 [place] 12/05/2017 0812   LABSPEC 1.012 12/05/2017 0812   PHURINE 5.0 12/05/2017 0812   GLUCOSEU NEGATIVE 12/05/2017 0812   HGBUR NEGATIVE 12/05/2017 0812   BILIRUBINUR NEGATIVE 12/05/2017 0812   KETONESUR NEGATIVE 12/05/2017 0812   PROTEINUR NEGATIVE 12/05/2017 0812   NITRITE NEGATIVE 12/05/2017 0812   LEUKOCYTESUR NEGATIVE 12/05/2017 0812      Time coordinating discharge: 40 minutes  SIGNED:  Vernell Leep, MD, FACP, [facility]. [facility] Pager [phone]  If 7PM-7AM, please contact night-coverage [email or web address] Password [email or web address] 12/09/2017, 12:41 PM

## 2017-12-09 NOTE — Progress Notes (Signed)
Discharge instructions (including medications) discussed with and copy provided to patient/caregiver 

## 2017-12-10 LAB — CULTURE, BLOOD (ROUTINE X 2)
CULTURE: NO GROWTH
Culture: NO GROWTH
SPECIAL REQUESTS: ADEQUATE

## 2017-12-12 LAB — AEROBIC/ANAEROBIC CULTURE W GRAM STAIN (SURGICAL/DEEP WOUND)

## 2017-12-12 LAB — AEROBIC/ANAEROBIC CULTURE (SURGICAL/DEEP WOUND)

## 2019-02-09 IMAGING — CT CT ABD-PELV W/ CM
2 of 5 series · 16 of 46 positions shown, 18 images · IV contrast (omnipaque)
Comparison: CT abdomen pelvis dated December 02, 2017.

CLINICAL DATA: Follow-up left lateral abdominal wall abscess.

EXAM:
CT ABDOMEN AND PELVIS WITH CONTRAST
TECHNIQUE: Multidetector CT imaging of the abdomen and pelvis was performed
using the standard protocol following bolus administration of
intravenous contrast.
CONTRAST:  100mL OMNIPAQUE IOHEXOL 300 MG/ML  SOLN

[Series 3: abdomen 5.0 · axial · 0.98mm/px · z∈[+775,+1220]mm · 13 of 103 slices shown, 15 images]
[im 7/103  soft-tissue]
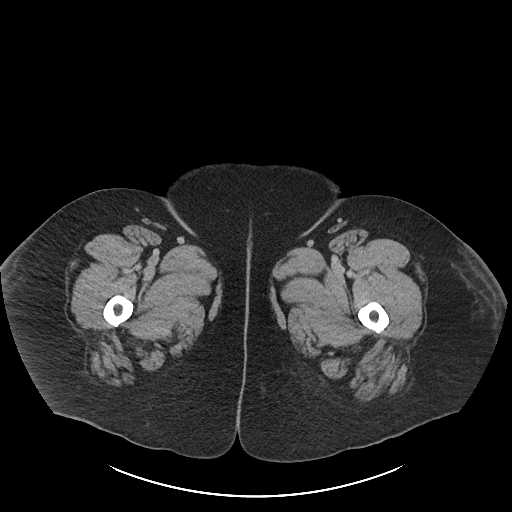
[im 7/103  bone]
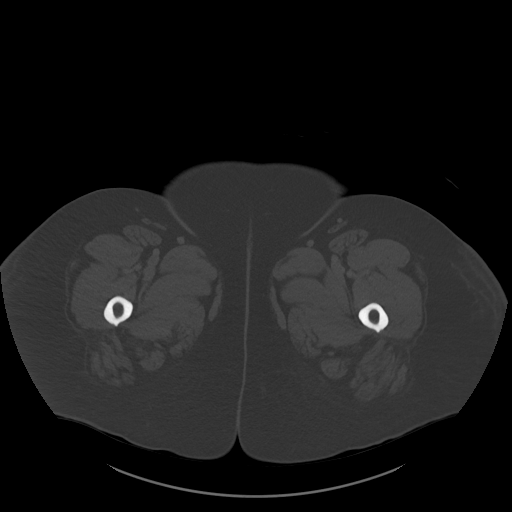
[im 14/103  soft-tissue]
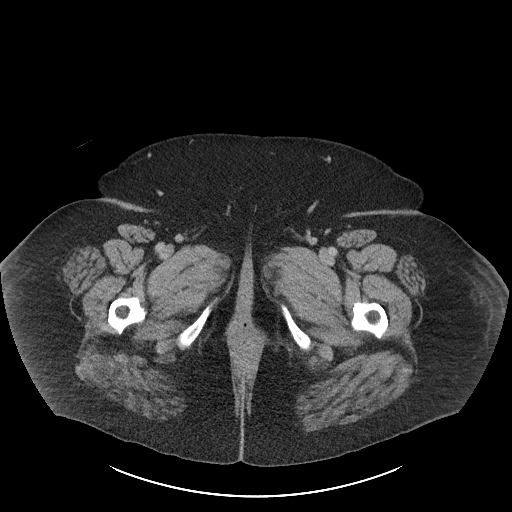
[im 21/103  soft-tissue]
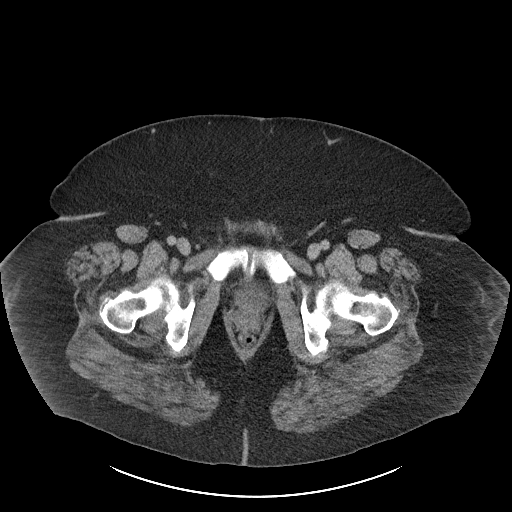
[im 28/103  soft-tissue]
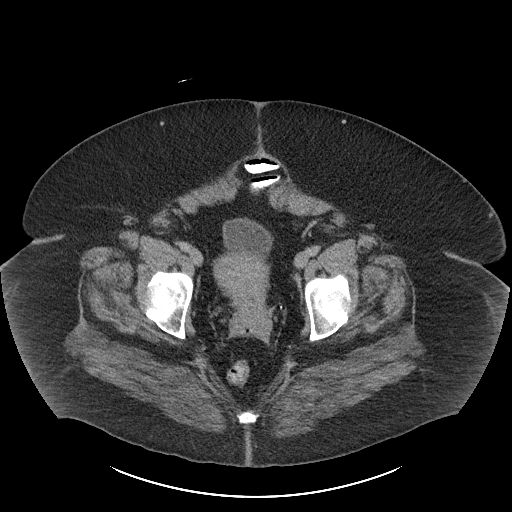
[im 35/103  soft-tissue]
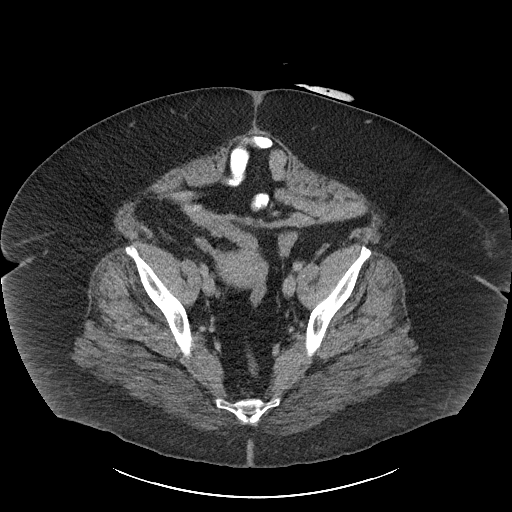
[im 41/103  soft-tissue]
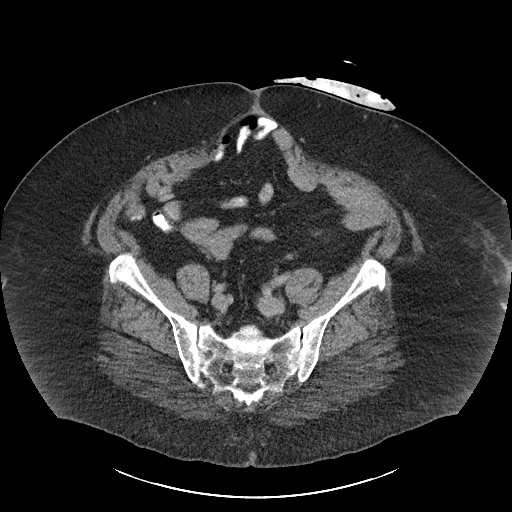
[im 55/103  soft-tissue]
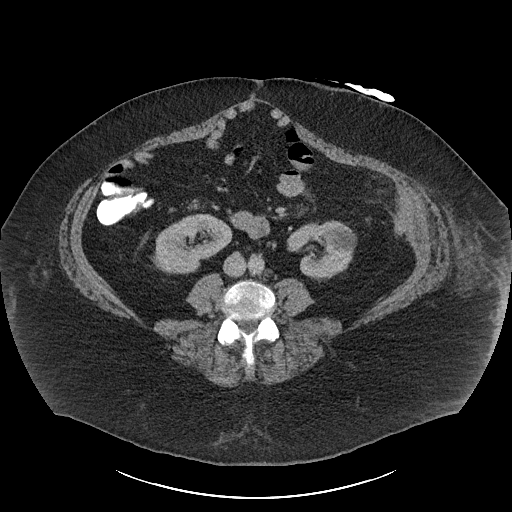
[im 62/103  soft-tissue]
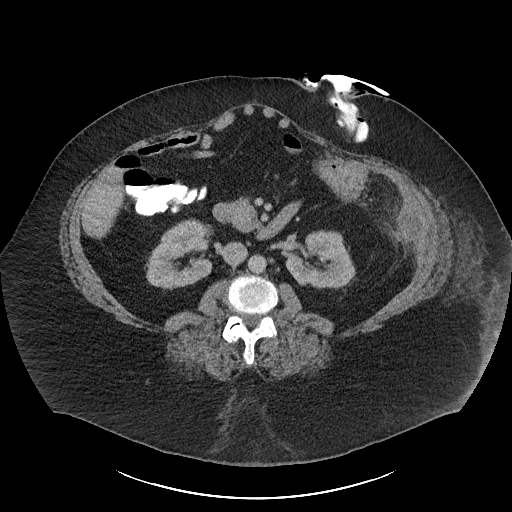
[im 69/103  soft-tissue]
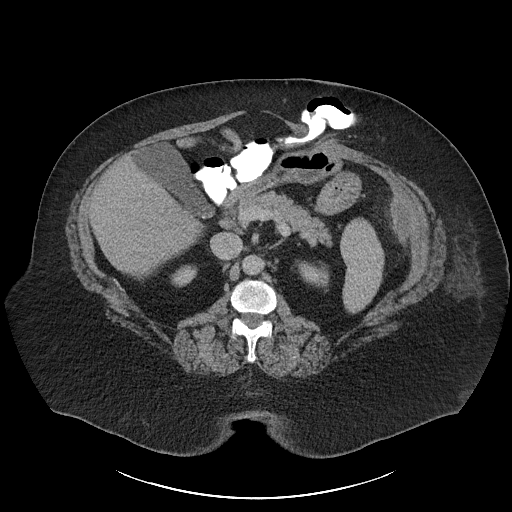
[im 69/103  bone]
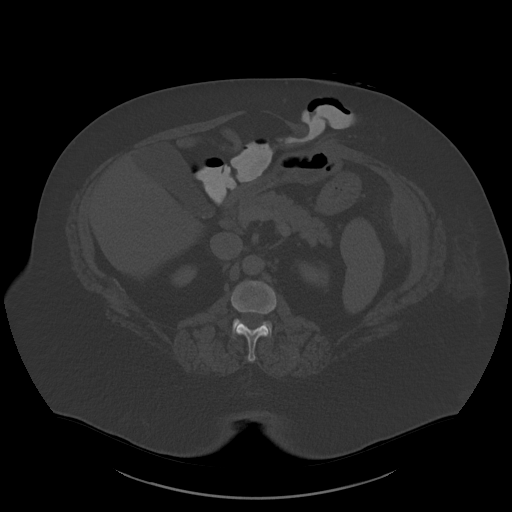
[im 75/103  soft-tissue]
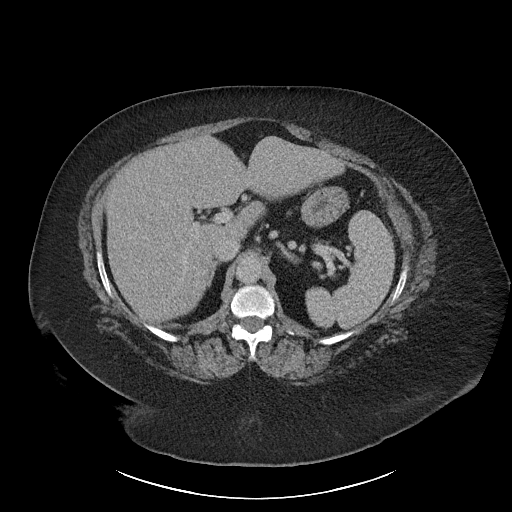
[im 82/103  soft-tissue]
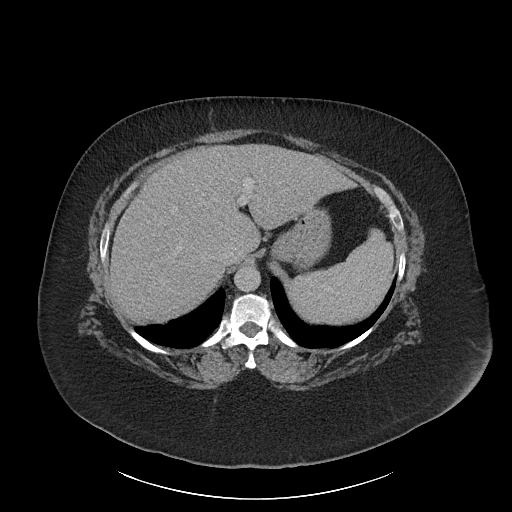
[im 89/103  soft-tissue]
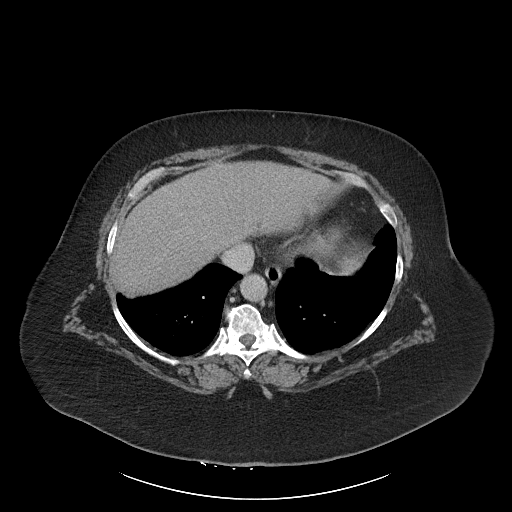
[im 96/103  soft-tissue]
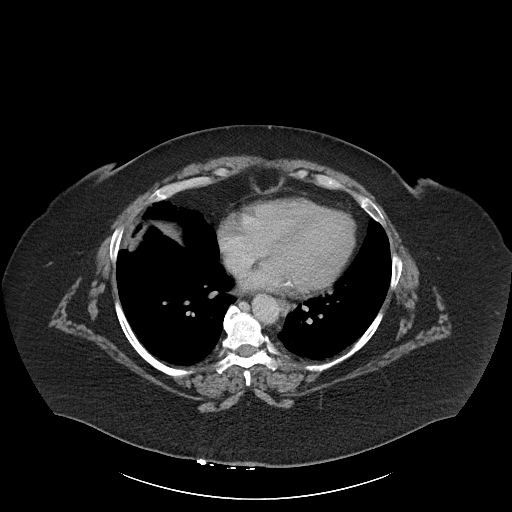

[Series 6: abdomen 3.0 mpr cor · coronal · 0.97mm/px · 3 of 139 slices shown]
[im 47/139  soft-tissue]
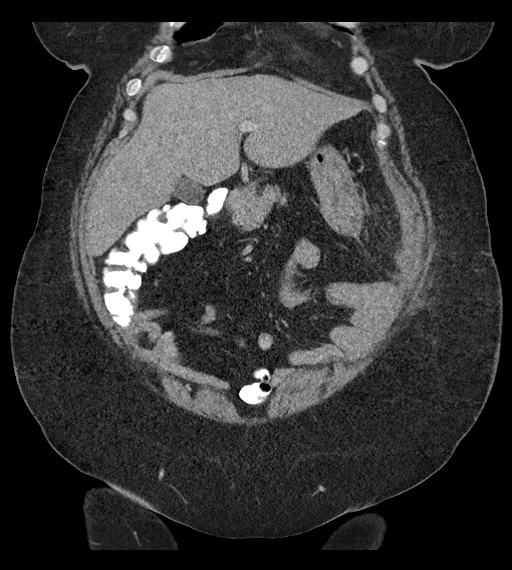
[im 62/139  soft-tissue]
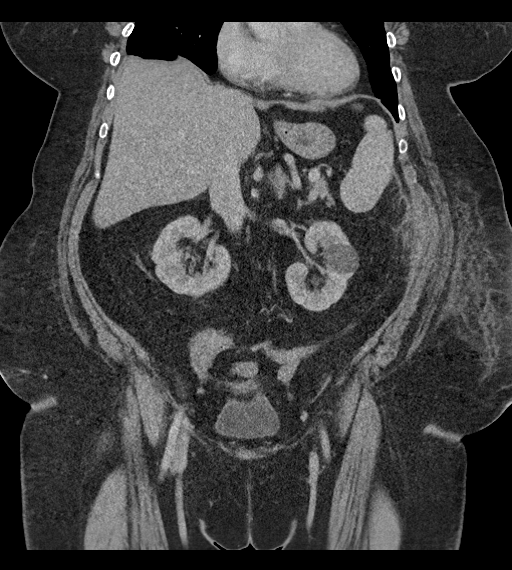
[im 77/139  soft-tissue]
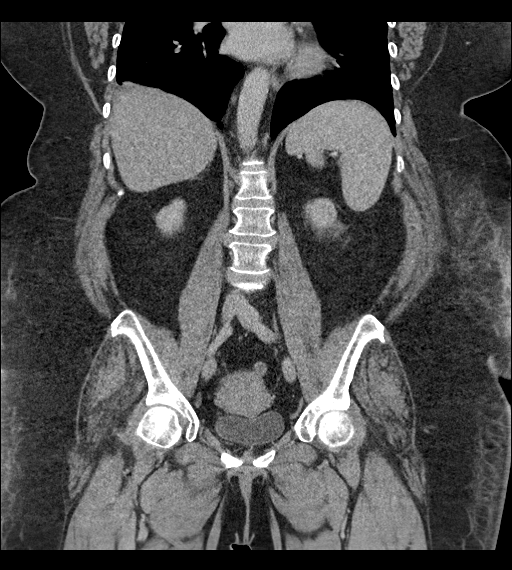

[16 of 46 positions shown; findings below may reference images not displayed]

FINDINGS: Lower chest: No acute abnormality.

Hepatobiliary: No focal liver abnormality. Unchanged tiny
gallstones. No gallbladder wall thickening or biliary dilatation.

Pancreas: Unremarkable. No pancreatic ductal dilatation or
surrounding inflammatory changes.

Spleen: Normal in size without focal abnormality.

Adrenals/Urinary Tract: The adrenal glands are unremarkable.
Unchanged left renal cyst. No renal or ureteral calculi. No
hydronephrosis. The bladder is unremarkable.

Stomach/Bowel: Unchanged partial left colectomy with left upper
quadrant colostomy. Normal appendix. The stomach and small bowel are
unremarkable.

Vascular/Lymphatic: No significant vascular findings are present. No
enlarged abdominal or pelvic lymph nodes.

Reproductive: Uterus and bilateral adnexa are unremarkable.

Other: Interval enlargement of the rim enhancing fluid collection
along the peritoneal surface of the left lateral abdominal wall, now
measuring 3.8 x 2.4 x 5.6 cm, previously 2.9 x 1.4 x 5.0 cm. No free
fluid or pneumoperitoneum. Unchanged diastasis of the ventral
abdominal wall containing non-dilated loops of small bowel.

Musculoskeletal: No acute or significant osseous findings.
IMPRESSION: 1. Enlarging abscess along the peritoneal surface of the left
lateral abdominal wall, now measuring 3.8 x 2.4 x 5.6 cm, previously
2.9 x 1.4 x 5.0 cm.
2. Unchanged cholelithiasis.

## 2019-11-30 IMAGING — US US GUIDANCE NEEDLE PLACEMENT
1 series · 12 of 12 positions shown · non-contrast
Comparison: none

INDICATION: 57-year-old female with a history of abdominal wall abscess.

[Series 1: us guidance needle placement · 0.09mm/px · 12 of 12 slices shown]
[im 1/12]
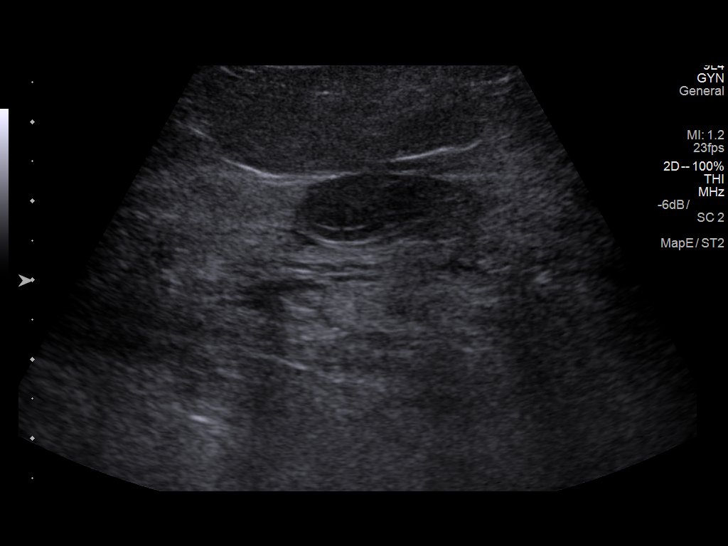
[im 2/12]
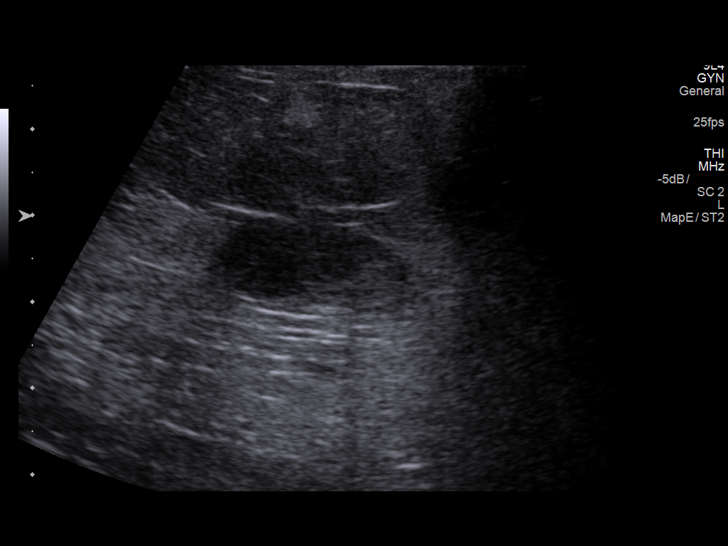
[im 3/12]
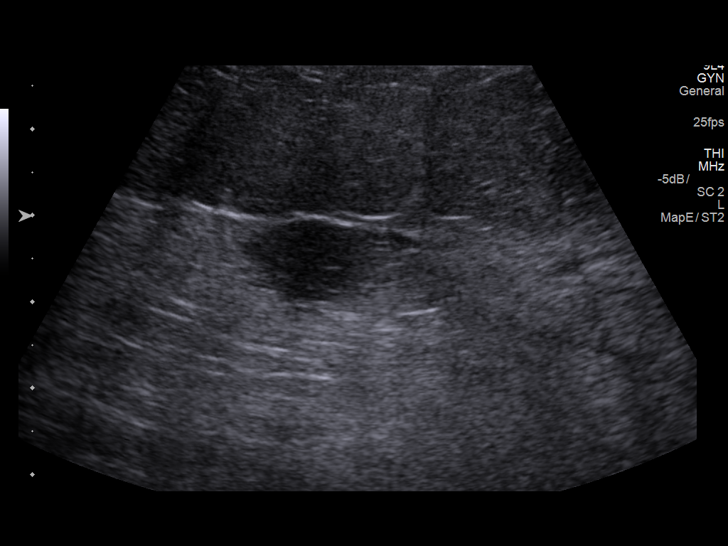
[im 4/12]
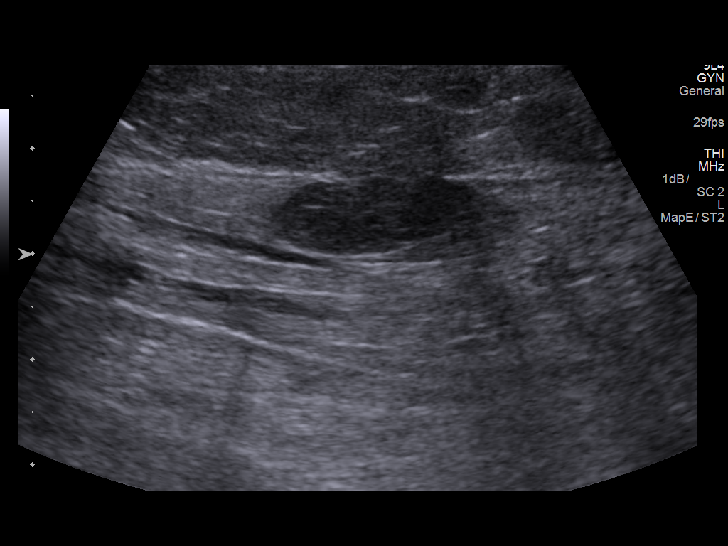
[im 5/12]
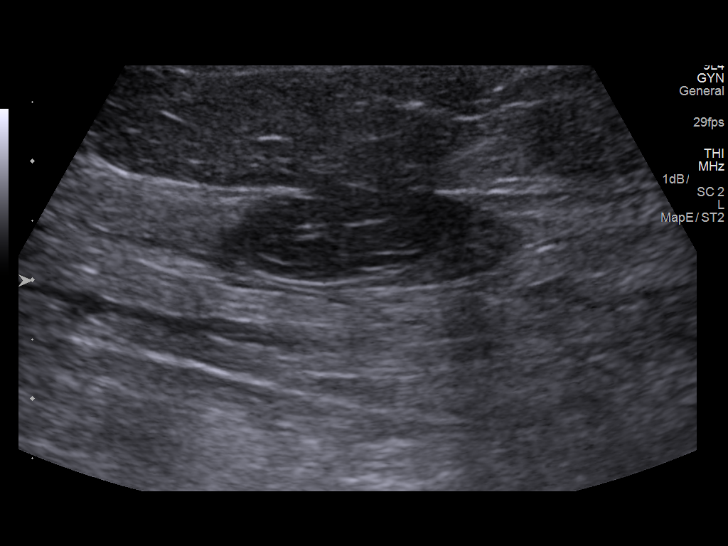
[im 6/12]
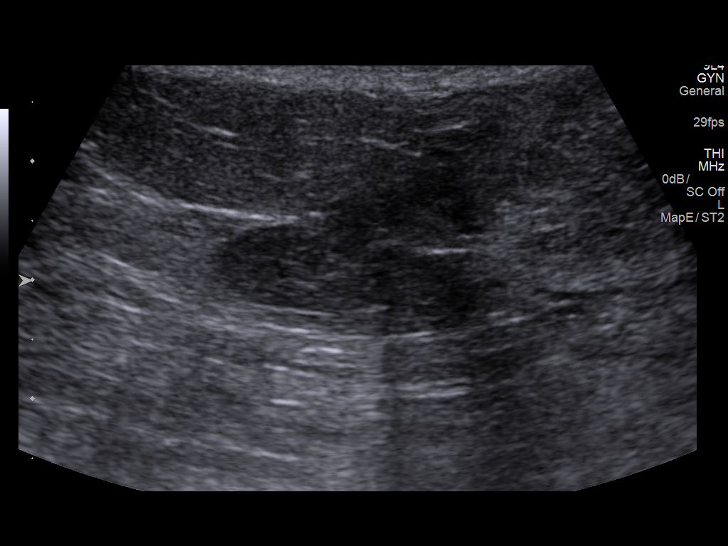
[im 7/12]
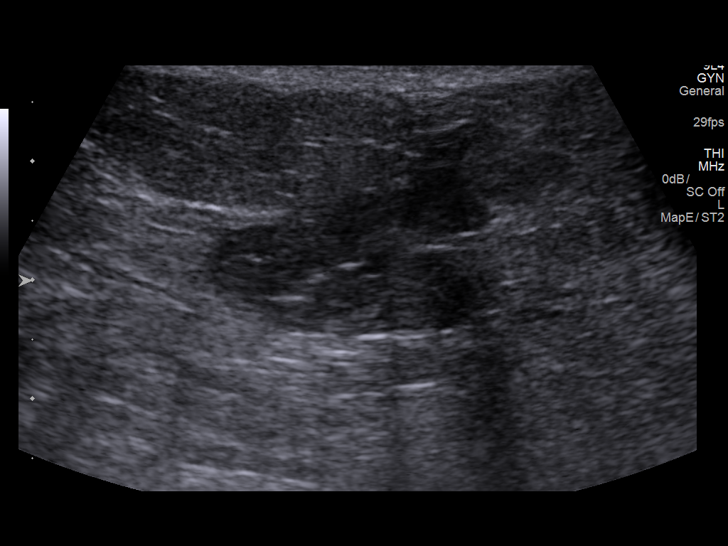
[im 8/12]
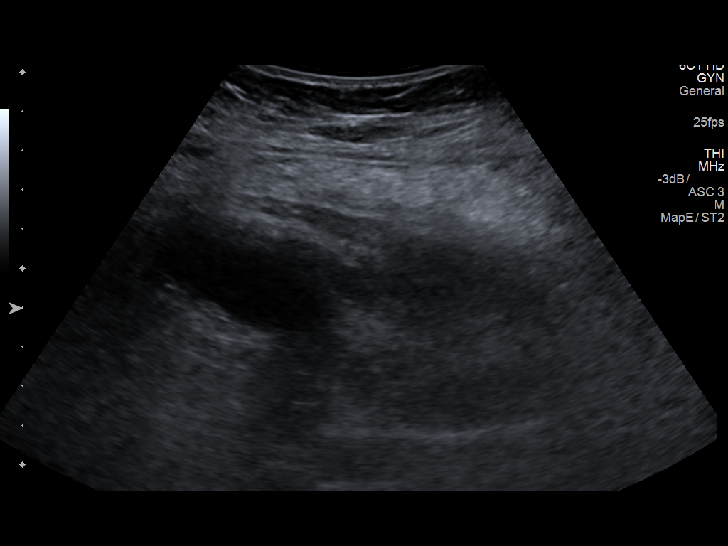
[im 9/12]
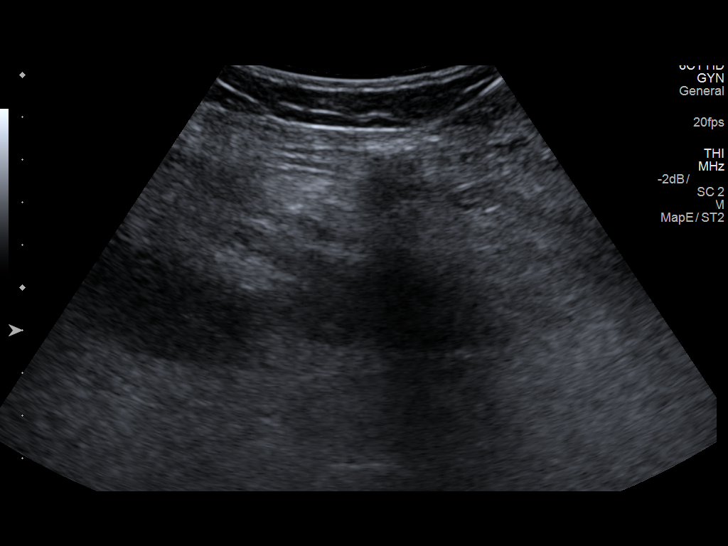
[im 10/12]
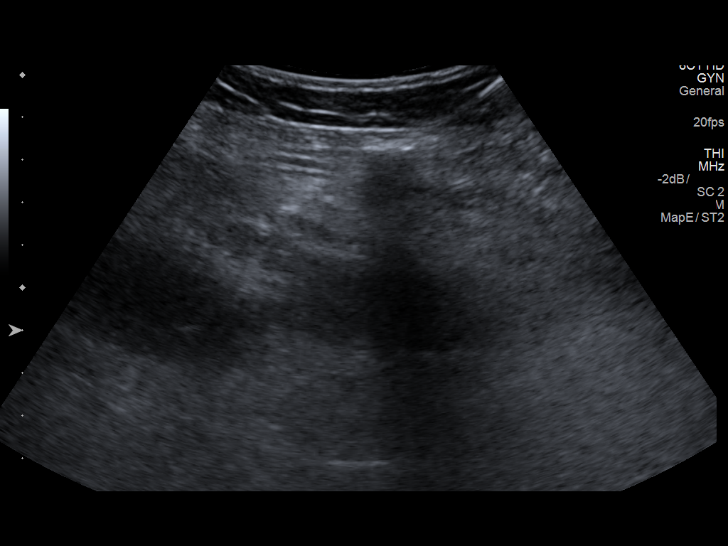
[im 11/12]
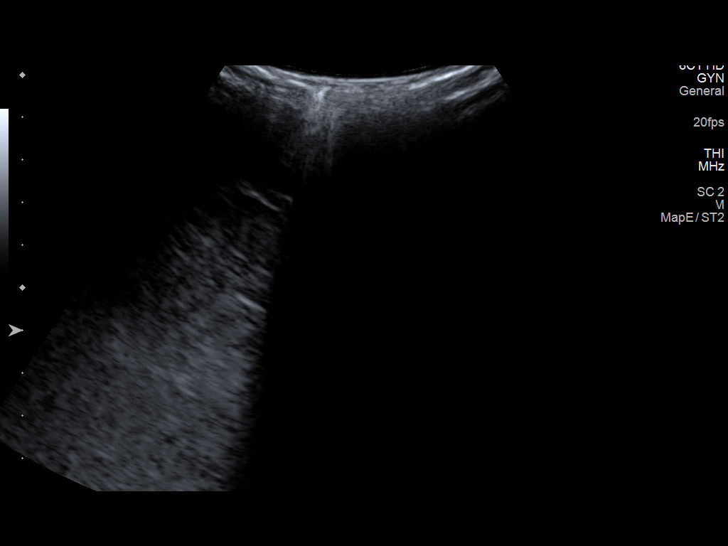
[im 12/12]
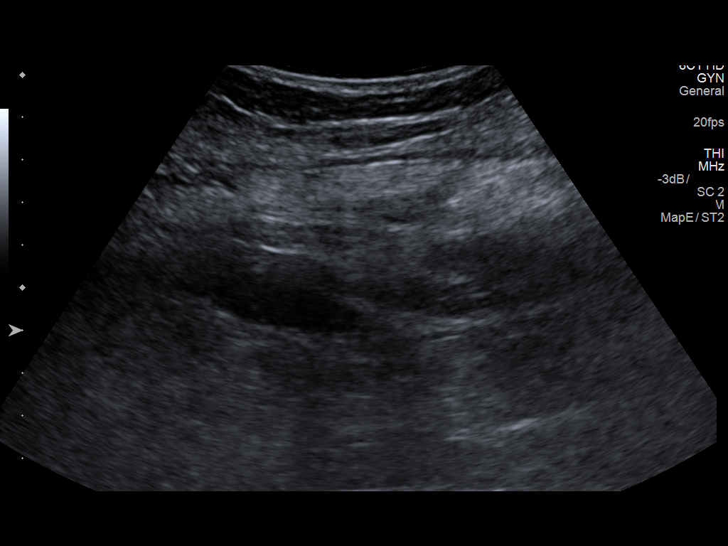

[12 of 12 positions shown; findings below may reference images not displayed]

EXAM:
IR ULTRASOUND GUIDED ASPIRATION/DRAINAGE

MEDICATIONS:
The patient is currently admitted to the hospital and receiving
intravenous antibiotics. The antibiotics were administered within an
appropriate time frame prior to the initiation of the procedure.

ANESTHESIA/SEDATION:
None

COMPLICATIONS:
None

PROCEDURE:
Informed written consent was obtained from the patient after a
thorough discussion of the procedural risks, benefits and
alternatives. All questions were addressed. Maximal Sterile Barrier
Technique was utilized including caps, mask, sterile gowns, sterile
gloves, sterile drape, hand hygiene and skin antiseptic. A timeout
was performed prior to the initiation of the procedure.

Patient positioned supine position on the ultrasound table. Images
were stored sent to PACs.

Patient is prepped and draped in usual sterile fashion. 1% lidocaine
was used for local anesthesia.

Using ultrasound guidance, a Yueh needle was advanced into
hypoechoic fluid collection of the abdominal wall. Approximately 40
cc appearing material was aspirated to collapse the cavity. Needle
was removed.

Durable dressing was placed.

Sample was sent to the lab for analysis.

Patient tolerated the procedure well and remained hemodynamically
stable throughout.

No complications were encountered and no significant blood loss.
IMPRESSION: Status post ultrasound-guided aspiration of left abdominal wall
abscess with 40 cc of purulent material removed. Sample was sent for
lab.
